# Patient Record
Sex: Female | Born: 1955
Health system: Southern US, Community
[De-identification: ages and names within clinical notes are randomized; demographics above are authoritative.]

## PROBLEM LIST (undated history)

## (undated) DIAGNOSIS — Z860100 Personal history of colon polyps, unspecified: Secondary | ICD-10-CM

## (undated) DIAGNOSIS — R011 Cardiac murmur, unspecified: Secondary | ICD-10-CM

## (undated) DIAGNOSIS — M1990 Unspecified osteoarthritis, unspecified site: Secondary | ICD-10-CM

## (undated) DIAGNOSIS — E785 Hyperlipidemia, unspecified: Secondary | ICD-10-CM

## (undated) DIAGNOSIS — K648 Other hemorrhoids: Secondary | ICD-10-CM

## (undated) DIAGNOSIS — N6459 Other signs and symptoms in breast: Secondary | ICD-10-CM

## (undated) DIAGNOSIS — Z87442 Personal history of urinary calculi: Secondary | ICD-10-CM

## (undated) DIAGNOSIS — K644 Residual hemorrhoidal skin tags: Secondary | ICD-10-CM

## (undated) DIAGNOSIS — I639 Cerebral infarction, unspecified: Secondary | ICD-10-CM

## (undated) DIAGNOSIS — I10 Essential (primary) hypertension: Secondary | ICD-10-CM

## (undated) DIAGNOSIS — K219 Gastro-esophageal reflux disease without esophagitis: Secondary | ICD-10-CM

## (undated) HISTORY — PX: ABDOMINAL HYSTERECTOMY: SHX81

## (undated) HISTORY — PX: ESOPHAGOGASTRODUODENOSCOPY: SHX1529

## (undated) HISTORY — DX: Personal history of urinary calculi: Z87.442

## (undated) HISTORY — DX: Personal history of colon polyps, unspecified: Z86.0100

## (undated) HISTORY — DX: Personal history of colonic polyps: Z86.010

## (undated) HISTORY — DX: Cerebral infarction, unspecified: I63.9

## (undated) HISTORY — PX: COLONOSCOPY: SHX174

## (undated) HISTORY — PX: WISDOM TOOTH EXTRACTION: SHX21

## (undated) HISTORY — DX: Cardiac murmur, unspecified: R01.1

## (undated) HISTORY — DX: Other signs and symptoms in breast: N64.59

## (undated) HISTORY — DX: Essential (primary) hypertension: I10

## (undated) HISTORY — DX: Hyperlipidemia, unspecified: E78.5

## (undated) HISTORY — DX: Residual hemorrhoidal skin tags: K64.4

## (undated) HISTORY — DX: Other hemorrhoids: K64.8

---

## 1998-11-11 ENCOUNTER — Other Ambulatory Visit: Admission: RE | Admit: 1998-11-11 | Discharge: 1998-11-11 | Payer: Self-pay | Admitting: Obstetrics and Gynecology

## 1999-12-05 ENCOUNTER — Other Ambulatory Visit: Admission: RE | Admit: 1999-12-05 | Discharge: 1999-12-05 | Payer: Self-pay | Admitting: Obstetrics and Gynecology

## 2001-01-07 ENCOUNTER — Other Ambulatory Visit: Admission: RE | Admit: 2001-01-07 | Discharge: 2001-01-07 | Payer: Self-pay | Admitting: Obstetrics and Gynecology

## 2001-01-23 ENCOUNTER — Encounter: Payer: Self-pay | Admitting: Obstetrics and Gynecology

## 2001-01-23 ENCOUNTER — Inpatient Hospital Stay (HOSPITAL_COMMUNITY): Admission: AD | Admit: 2001-01-23 | Discharge: 2001-01-23 | Payer: Self-pay | Admitting: Obstetrics and Gynecology

## 2001-02-25 ENCOUNTER — Encounter: Payer: Self-pay | Admitting: Obstetrics and Gynecology

## 2001-02-25 ENCOUNTER — Inpatient Hospital Stay (HOSPITAL_COMMUNITY): Admission: AD | Admit: 2001-02-25 | Discharge: 2001-02-26 | Payer: Self-pay | Admitting: Obstetrics and Gynecology

## 2001-04-19 ENCOUNTER — Observation Stay (HOSPITAL_COMMUNITY): Admission: AD | Admit: 2001-04-19 | Discharge: 2001-04-19 | Payer: Self-pay | Admitting: Obstetrics and Gynecology

## 2001-04-25 ENCOUNTER — Ambulatory Visit (HOSPITAL_COMMUNITY): Admission: RE | Admit: 2001-04-25 | Discharge: 2001-04-25 | Payer: Self-pay | Admitting: Obstetrics and Gynecology

## 2001-04-25 ENCOUNTER — Encounter: Payer: Self-pay | Admitting: Obstetrics and Gynecology

## 2001-06-25 ENCOUNTER — Inpatient Hospital Stay (HOSPITAL_COMMUNITY): Admission: RE | Admit: 2001-06-25 | Discharge: 2001-06-28 | Payer: Self-pay | Admitting: Obstetrics and Gynecology

## 2001-06-25 ENCOUNTER — Encounter (INDEPENDENT_AMBULATORY_CARE_PROVIDER_SITE_OTHER): Payer: Self-pay | Admitting: Specialist

## 2002-02-03 ENCOUNTER — Other Ambulatory Visit: Admission: RE | Admit: 2002-02-03 | Discharge: 2002-02-03 | Payer: Self-pay | Admitting: Obstetrics and Gynecology

## 2003-03-04 ENCOUNTER — Other Ambulatory Visit: Admission: RE | Admit: 2003-03-04 | Discharge: 2003-03-04 | Payer: Self-pay | Admitting: Obstetrics and Gynecology

## 2003-12-14 ENCOUNTER — Encounter: Admission: RE | Admit: 2003-12-14 | Discharge: 2004-03-13 | Payer: Self-pay | Admitting: Family Medicine

## 2004-04-28 ENCOUNTER — Other Ambulatory Visit: Admission: RE | Admit: 2004-04-28 | Discharge: 2004-04-28 | Payer: Self-pay | Admitting: Obstetrics and Gynecology

## 2004-07-20 ENCOUNTER — Ambulatory Visit (HOSPITAL_COMMUNITY): Admission: RE | Admit: 2004-07-20 | Discharge: 2004-07-20 | Payer: Self-pay | Admitting: Gastroenterology

## 2004-07-20 ENCOUNTER — Encounter (INDEPENDENT_AMBULATORY_CARE_PROVIDER_SITE_OTHER): Payer: Self-pay | Admitting: *Deleted

## 2008-01-03 HISTORY — PX: KIDNEY STONE SURGERY: SHX686

## 2008-02-18 ENCOUNTER — Ambulatory Visit (HOSPITAL_COMMUNITY): Admission: RE | Admit: 2008-02-18 | Discharge: 2008-02-18 | Payer: Self-pay | Admitting: Family Medicine

## 2008-06-25 ENCOUNTER — Encounter (HOSPITAL_COMMUNITY): Admission: RE | Admit: 2008-06-25 | Discharge: 2008-06-25 | Payer: Self-pay | Admitting: Cardiology

## 2008-09-09 ENCOUNTER — Ambulatory Visit (HOSPITAL_COMMUNITY): Admission: RE | Admit: 2008-09-09 | Discharge: 2008-09-09 | Payer: Self-pay | Admitting: Family Medicine

## 2008-09-11 ENCOUNTER — Ambulatory Visit (HOSPITAL_COMMUNITY): Admission: RE | Admit: 2008-09-11 | Discharge: 2008-09-11 | Payer: Self-pay | Admitting: Family Medicine

## 2009-01-05 ENCOUNTER — Emergency Department (HOSPITAL_COMMUNITY): Admission: EM | Admit: 2009-01-05 | Discharge: 2009-01-05 | Payer: Self-pay | Admitting: Emergency Medicine

## 2009-02-22 ENCOUNTER — Ambulatory Visit (HOSPITAL_COMMUNITY): Admission: RE | Admit: 2009-02-22 | Discharge: 2009-02-22 | Payer: Self-pay | Admitting: Urology

## 2010-03-19 LAB — COMPREHENSIVE METABOLIC PANEL
CO2: 25 mEq/L (ref 19–32)
Calcium: 8.7 mg/dL (ref 8.4–10.5)
Creatinine, Ser: 0.85 mg/dL (ref 0.4–1.2)
GFR calc Af Amer: >60 mL/min (ref >60)
GFR calc non Af Amer: >60 mL/min (ref >60)
Glucose, Bld: 190 mg/dL — ABNORMAL HIGH (ref 70–99)
Total Protein: 6.6 g/dL (ref 6.0–8.3)

## 2010-03-19 LAB — CBC
Hemoglobin: 13.4 g/dL (ref 12.0–15.0)
MCHC: 34.5 g/dL (ref 30.0–36.0)
MCV: 93.3 fL (ref 78.0–100.0)
RBC: 4.17 MIL/uL (ref 3.87–5.11)
RDW: 12.9 % (ref 11.5–15.5)

## 2010-03-19 LAB — URINALYSIS, ROUTINE W REFLEX MICROSCOPIC
Glucose, UA: 500 mg/dL — AB
Ketones, ur: 15 mg/dL — AB
Nitrite: NEGATIVE
pH: 5 (ref 5.0–8.0)

## 2010-03-19 LAB — URINE MICROSCOPIC-ADD ON

## 2010-03-19 LAB — DIFFERENTIAL
Lymphocytes Relative: 28 % (ref 12–46)
Lymphs Abs: 3.2 10*3/uL (ref 0.7–4.0)
Monocytes Relative: 4 % (ref 3–12)
Neutrophils Relative %: 65 % (ref 43–77)

## 2010-03-24 LAB — COMPREHENSIVE METABOLIC PANEL
ALT: 40 U/L — ABNORMAL HIGH (ref 0–35)
AST: 37 U/L (ref 0–37)
Alkaline Phosphatase: 45 U/L (ref 39–117)
CO2: 20 mEq/L (ref 19–32)
Chloride: 105 mEq/L (ref 96–112)
GFR calc Af Amer: >60 mL/min (ref >60)
GFR calc non Af Amer: 54 mL/min — ABNORMAL LOW (ref >60)
Glucose, Bld: 238 mg/dL — ABNORMAL HIGH (ref 70–99)
Potassium: 4.2 mEq/L (ref 3.5–5.1)
Sodium: 138 mEq/L (ref 135–145)
Total Bilirubin: 0.9 mg/dL (ref 0.3–1.2)

## 2010-03-24 LAB — GLUCOSE, CAPILLARY

## 2010-05-20 NOTE — Discharge Summary (Signed)
[facility] of [facility]  Patient:    Emma Duran, Emma Duran Visit Number: [id number] MRN: [id number]          Service Type: OBS Location: 9300 [id number] Attending Physician:  Frederich Balding Dictated by:   Juluis Mire, M.D. Admit Date:  04/19/2001 Discharge Date: 04/19/2001                             Discharge Summary  DIAGNOSIS:  Viral gastroenteritis.  DISCHARGE DIAGNOSIS:  Viral gastroenteritis.  PROCEDURE:  Hydration.  HISTORY OF PRESENT ILLNESS:  For complete history and physical, please see the dictated note.  HOSPITAL COURSE:  The patient was brought in, and begun on IV hydration and IV emetics.  Her laboratory work was significant in that her glucose was elevated at 179, her white count was 19,600, hemoglobin and platelet count were stable. She was maintained on IV hydration through the evening.  By the following morning was doing much better.  She remained afebrile.  Her abdominal examination was completely benign.  It was soft, minimal tenderness.  Bowel sounds remained active.  She was tolerating liquids, and was discharged home to follow up in the office on Monday.  In terms of complications, none were encountered during stay in the hospital.  CONDITION ON DISCHARGE:  The patient is discharged in stable condition.  DISPOSITION:  The patient is discharged home.  DISCHARGE MEDICATIONS:  She has Phenergan at home, as well as pain medications.  She is to call with worsening symptomatology.  FOLLOWUP:  On Monday. Dictated by:   Juluis Mire, M.D. Attending Physician:  Frederich Balding DD:  04/19/01 TD:  04/20/01 Job: [id number]

## 2010-05-20 NOTE — Discharge Summary (Signed)
[facility] of [facility]  Patient:    Emma Duran, Emma Duran Visit Number: [id number] MRN: [id number]          Service Type: GYN Location: 9300 [id number] Attending Physician:  Rhina Brackett Dictated by:   Duke Salvia. Marcelle Overlie, M.D. Admit Date:  06/25/2001 Discharge Date: 06/28/2001                             Discharge Summary  DISCHARGE DIAGNOSES:               1. Symptomatic leiomyoma.                                    2. Total abdominal hysterectomy/bilateral                                       salpingo-oophorectomy this admission.  HISTORY AND PHYSICAL EXAMINATION:  Please see admission H&P for details. Briefly, a 55 year old G1, P1, with a 17-month history of recurrent abdominal pelvic pain with document of leiomyoma presents now for definitive hysterectomy.  HOSPITAL COURSE:                   On June 25, 2001, under general anesthesia, the patient underwent TAH/BSO and excision of an anterior abdominal wall skin lesion.  On the first postop day she was afebrile, her hemoglobin was preop 12.8, on June 26, 2001 was 10.8, her diet was slowly advanced, the IV and catheter were removed, she remained afebrile.  On the second postop day again was afebrile, tolerating a regular diet, still complaining of some gas discomfort but was given a suppository at that point.  Later that afternoon she had a bowel movement and felt much improved and by June 28, 2001 morning was ambulating without difficulty, she had established normal bowel and urinary function, the incision was clean and dry and she was ready for discharge at that point.  LABORATORY DATA:                   Preop hemoglobin 12.8, postop on June 26, 2001 was 10.8.  UA negative.  Blood type A positive, antibody screen was negative.  CMET was normal.  EKG showed NSR.  Pathology report is still pending.  DISPOSITION:                       The patient is discharged on Tylox p.r.n. pain, she has a Climara  patch applied, will return to our office in 1 week to have the incision checked, advised to report any incisional redness or drainage, increased pain or bleeding or fever over 101.  She was given specific instructions regarding diet, sex, and exercise.  CONDITION:                         Good.  ACTIVITY:                          Graded increase. Dictated by:   Duke Salvia. Marcelle Overlie, M.D. Attending Physician:  Rhina Brackett DD:  06/28/01 TD:  06/30/01 Job: [id number]

## 2010-05-20 NOTE — Op Note (Signed)
Emma Duran, Emma Duran                ACCOUNT NO.:  [id number]   MEDICAL RECORD NO.:  [id number]          PATIENT TYPE:  AMB   LOCATION:  ENDO                         FACILITY[facility]  [facility]   PHYSICIAN:  Petra Kuba, M.D.    DATE OF BIRTH:  [birth year]   DATE OF PROCEDURE:  07/20/2004  DATE OF DISCHARGE:                                 OPERATIVE REPORT   PROCEDURE:  Colonoscopy biopsy.   INDICATIONS:  Screening.   Consent was signed after risks, benefits, methods, and options were  thoroughly discussed in the office by Jan, our colonoscopy nurse.   MEDICINES USED:  Demerol 70, Versed 7.   DESCRIPTION OF PROCEDURE:  Rectal inspection is pertinent for external  hemorrhoids, small. Digital exam was negative. Video pediatric adjustable  colonoscope was inserted and with abdominal pressure able to be advanced  around the colon to the cecum. No position changes were needed. No  abnormalities were seen on insertion. The cecum was identified by the  appendiceal orifice and the ileocecal valve. Prep was adequate. There was  some liquid stool that required washing and suctioning. On slow withdrawal  through the colon, the cecum, ascending, and transverse were normal. The  scope was withdrawn around the left side of the colon, there was an  occasional left-sided diverticula and some minimal probable prep induced  inflammation. In the sigmoid were three tiny bumps, two were hyperplastic  appearing and they were all cold biopsied x1 or 2 and put in the same  container. No other abnormalities were seen as we slowly withdrew back to  the rectum. Anorectal pull-through and retroflexion confirmed some small  hemorrhoids. The scope was straightened and readvanced a short ways up the  left side of the colon, air was suctioned, scope removed. The patient  tolerated the procedure well. There was no obvious immediate complication.   ENDOSCOPIC DIAGNOSIS:  1.  Internal and external hemorrhoids.  2.   Occasional left sided diverticula.  3.  Three tiny questionable left-sided polyps cold biopsied.  4.  Minimal left sided inflammation probably prep induced.  5.  Otherwise within normal limits to the cecum.   PLAN:  Await pathology to determine future colonic screening but probably  recheck in five years. Happy to see back p.r.n. otherwise return care to Dr.  Arvilla Market for the customary health care maintenance to include yearly  rectal's and guaiac's.       MEM/MEDQ  D:  07/20/2004  T:  07/20/2004  Job:  [id number]   cc:   Donia Guiles, M.D.  [address] [place]  [place] [address]  Fax: [phone]

## 2010-05-20 NOTE — H&P (Signed)
[facility] of [facility]  Patient:    Emma Duran, IGLEHART Visit Number: [id number] MRN: [id number]          Service Type: Attending:  Duke Salvia. Marcelle Overlie, M.D. Dictated by:   Duke Salvia. Marcelle Overlie, M.D. Adm. Date:  06/25/01                           History and Physical  CHIEF COMPLAINT:              Recurrent pelvic pain.  HISTORY OF PRESENT ILLNESS:   A 55 year old G1 P1 who has been using foam and condoms for contraception who has had a six-month history of recurrent abdominopelvic pain.  She was hospitalized early February with right lower quadrant pain felt to be secondary to a viral syndrome.  She had a sonohysterogram done around that time that showed a uterus that was 12 x 7 x 9.5 cm with multiple small fibroids in the 3 cm range.  Endometrial biopsy was performed at that time.  She was started on Hemocyte and Mircette b.i.d. to control her abnormal bleeding.  The endometrial biopsy showed proliferative endometrium; no other abnormalities noted.  She was continued on Mircette that did not effectively control her symptoms and presented again April 18, 2001 with lower abdominal pain, nausea, and vomiting.  A CT scan was done April 25, 2001 that showed liver, gallbladder, and upper abdomen unremarkable, enlarged uterus with fibroids noted.  These recurrent episodes of pain did seem to localize into the pelvic area consistent with being secondary to leiomyoma.  She presents now for a TAH/BSO. This procedure including risks of bleeding, infection, transfusion, adjacent organ injury, the possible need for additional surgery discussed.  Other complications such as wound infection, phlebitis, the possible need for ERT all reviewed with her which she understands and accepts.  PAST MEDICAL HISTORY:  ALLERGIES:                    None.  OPERATIONS:                   None.  OBSTETRICAL HISTORY:          One vaginal delivery at term.  REVIEW OF SYSTEMS:             Otherwise negative.  CURRENT MEDICATIONS:          Vicoprofen p.r.n.  FAMILY HISTORY:               Noncontributory.  SOCIAL HISTORY:               Unremarkable.  PHYSICAL EXAMINATION:  VITAL SIGNS:                  Temperature 98.2, blood pressure 130/82.  HEENT:                        Unremarkable.  NECK:                         Supple without masses.  LUNGS:                        Clear.  CARDIOVASCULAR:               Regular rate and rhythm without murmurs, rubs, or gallops noted.  BREASTS:  Without masses.  ABDOMEN:                      Soft, flat, and nontender.  PELVIC:                       Normal external genitalia, vagina and cervix clear.  Uterus was 10-12 weeks size.  Adnexa negative.  IMPRESSION:                   Chronic pelvic pain secondary to leiomyoma.  PLAN:                         TAH/BSO.  Procedure and risks reviewed as above. Dictated by:   Duke Salvia. Marcelle Overlie, M.D. Attending:  Duke Salvia. Marcelle Overlie, M.D. DD:  06/20/01 TD:  06/20/01 Job: [id number]

## 2010-05-20 NOTE — H&P (Signed)
[facility] of [facility]  Patient:    Emma Duran, Emma Duran Visit Number: [id number] MRN: [id number]          Service Type: OBS Location: 9300 [id number] Attending Physician:  Frederich Balding Dictated by:   Juluis Mire, M.D. Admit Date:  04/19/2001                           History and Physical  REASON FOR ADMISSION:         The patient is a 55 year old gravida 1 para 1 married white female who presents complaining of increasing nausea and vomiting.  This has been unresponsive to antiemetics at home.  HISTORY OF PRESENT ILLNESS:   The patient was seen in the office on April 18, 2001.  She had been experiencing increasing nausea, vomiting, and pelvic pain. She had been called in some p.o. as well as some suppository form of Phenergan.  Exam in the office revealed a temperature of 99.4.  Her abdominal exam was basically benign.  It was felt that she had a viral gastroenteritis. She subsequently reported later this evening that nothing was staying down despite the Phenergan suppositories.  Was brought in for IV hydration and observation.  She states her bowel function had been normal.  She had no voiding difficulties.  She states she had not felt like she had a temperature at home and her abdominal pain had improved somewhat.  ALLERGIES:                    No known drug allergies.  MEDICATIONS:                  Lipitor, WelChol, and lisinopril.  PAST MEDICAL HISTORY:         She has had the usual childhood diseases without significant sequelae.  PAST SURGICAL HISTORY:        No prior surgical history.  OBSTETRICAL HISTORY:          She has had one spontaneous vaginal delivery.  FAMILY HISTORY:               Noncontributory.  SOCIAL HISTORY:               No tobacco or alcohol use.  REVIEW OF SYSTEMS:            Noncontributory.  PHYSICAL EXAMINATION:  VITAL SIGNS:                  Patient is afebrile with stable vital signs.  LUNGS:                         Clear.  CARDIOVASCULAR:               Regular rhythm and rate, no murmurs or gallops.  ABDOMEN:                      Is basically benign.  Bowel sounds are active. She has mild suprapubic tenderness, no peritoneal signs.  PELVIC:                       Uterus is upper limits of normal size, slightly irregular.  Adnexa unremarkable.  There is really not significant pelvic tenderness.  EXTREMITIES:                  Trace edema.  NEUROLOGIC:  Grossly within normal limits.  BASIC IMPRESSION:             Possible viral gastroenteritis.  PLAN:                         The patient will be brought in for IV hydration, antiemetics.  We will seen how she responds through the evening. Dictated by:   Juluis Mire, M.D. Attending Physician:  Frederich Balding DD:  04/19/01 TD:  04/19/01 Job: [id number]

## 2010-05-20 NOTE — Op Note (Signed)
[facility] of [facility]  Patient:    Emma Duran, Emma Duran Visit Number: [id number] MRN: [id number]          Service Type: GYN Location: 9300 [id number] 01 Attending Physician:  Rhina Brackett Dictated by:   Duke Salvia. Marcelle Overlie, M.D. Proc. Date: 06/25/01 Admit Date:  06/25/2001                             Operative Report  PREOPERATIVE DIAGNOSIS:       Chronic pelvic pain, leiomyoma, anterior abdominal wall skin lesion.  POSTOPERATIVE DIAGNOSIS:      Chronic pelvic pain, leiomyoma, anterior abdominal wall skin lesion.  OPERATION:                    TAH, BSO, excision of anterior abdominal wall skin lesion.  SURGEON:                      Duke Salvia. Marcelle Overlie, M.D.  ASSISTANT:                    Raynald Kemp, M.D.  ANESTHESIA:                   General endotracheal anesthesia.  COMPLICATIONS:                None.  DRAINS:                       Foley catheter.  ESTIMATED BLOOD LOSS:         200 cc.  DESCRIPTION OF PROCEDURE:     The patient was taken to the operating room and after an adequate level of general endotracheal anesthesia was obtained with the patient supine, the abdomen was prepped and draped in the usual fashion for sterile abdominal procedures.  The vagina was prepped separately with Betadine and Foley catheter positioned.  A transverse incision was made three fingerbreadths above the symphysis and carried down to the fascia which was incised and extended transversely.  The rectus muscles were divided in the midline and the peritoneum entered superiorly without incident and extended in a vertical manner.  The upper abdomen was explored and was unremarkable. OConnor-OSullivan retractor was positioned.  The bowel was packed superiorly out of the field.  The patient was placed in Trendelenburg.  The uterus was enlarged to approximately 12 weeks size with a large fundal fibroid.  Adnexa were unremarkable.  Long Kelly clamps were then placed at  each utero-ovarian pedicle.  Starting on the left, the round ligament was coagulated and cut. The retroperitoneal space on that side was developed.  The left IP ligament was identified.  The course of the left ureter was well below.  The left IP ligament was coagulated x2 and cut.  The exact same repeated on the opposite side again after carefully identifying the course of the ureter well below the operative site.  The anterior peritoneum was carried around to the midline because the large fundal fibroid was obstructing the view, a myomectomy was performed removing the fibroid to allow for better visualization.  The bladder flap was then advanced inferiorly with sharp and blunt dissection.  The uterine vasculature pedicle was skeletonized, clamped, coagulated, and cut. The same repeated on the opposite side.  The clamp, coag, and cut technique used down to the area of the uterosacral ligaments staying close to the uterus.  The  uterosacral ligament was then clamped, divided, and suture ligated with 0 Dexon on either side.  The remaining cervical vaginal pedicle was clamped, divided, and suture ligated with 0 Dexon and held and the specimen excised.  The vaginal cuff closed with a running locked 2-0 Dexon suture.  The pelvis was then irrigated with saline and aspirated.  All major pedicles were inspected and noted to be hemostatic.  Prior to closure, sponge, needle, and instrument counts were correct x2.  The rectus muscles were reapproximated with 2-0 Dexon interrupted sutures.  The fascia closed from laterally to midline on either side with a 0 PDS suture.  The subcutaneous fat was hemostatic.  Clips and Steri-Strips were used on the skin.  The upper abdominal skin lesion was prepped separately, excised in an ellipse.  Six interrupted 4-0 nylon sutures used to reapproximated the skin with excellent hemostasis.  She tolerated this well and went to the recovery room in  good condition. Dictated by:   Duke Salvia. Marcelle Overlie, M.D. Attending Physician:  Rhina Brackett DD:  06/25/01 TD:  06/25/01 Job: [id number]

## 2010-05-20 NOTE — Discharge Summary (Signed)
[facility] of [facility]  Patient:    Emma Duran, Emma Duran Visit Number: [id number] MRN: [id number]          Service Type: Attending:  Duke Salvia. Marcelle Overlie, M.D. Dictated by:   Duke Salvia. Marcelle Overlie, M.D. Adm. Date:  02/25/01 Disc. Date: 02/26/01                             Discharge Summary  DISCHARGE DIAGNOSES:          Probable viral gastroenteritis.  SUMMARY OF HISTORY & PHYSICAL: Please see admission note for details. Briefly, 55 year old, G1, P62, LMP February 14, presents with a 24-hour history of nausea and vomiting.  She was seen in triage with a temperature of 99.1, examined by the nurse practitioner and was felt to have nausea and vomiting and unspecified abdominal pain along with leiomyomata noted on ultrasound. She was admitted for pain management and further hydration.  HOSPITAL COURSE:              On admission, temperature 99.2, blood pressure 156/88.  WBC 13.3, hemoglobin 14.  Urine pregnancy test was negative. UA was negative except for small ketones.  Ultrasound showed moderate free fluid with leiomyoma.  On the following a.m., temperature was 99.3, WBC 8900.  Ultrasound was normal except for two fibroids in the 3 cm range.  Her abdominal exam on February 25 was much improved.  Her IV hydration was continued until that afternoon, and she was discharged home on antiemetics and Darvocet for pain. She will return to our office in one week.  DISPOSITION:                  The patient was discharged on Zofran p.r.n. nausea, Darvocet p.r.n. pain.  She will return to our office in two to three days.  Advise or report fever of 101, persistent nausea or vomiting, or worsening of abdominal pain.Dictated by:   Duke Salvia. Marcelle Overlie, M.D. Attending:  Duke Salvia. Marcelle Overlie, M.D. DD:  04/08/01 TD:  04/08/01 Job: [id number]

## 2010-05-28 ENCOUNTER — Inpatient Hospital Stay (INDEPENDENT_AMBULATORY_CARE_PROVIDER_SITE_OTHER)
Admission: RE | Admit: 2010-05-28 | Discharge: 2010-05-28 | Disposition: A | Discharge status: Home / Self Care | Payer: Self-pay | Source: Ambulatory Visit | Attending: Family Medicine | Admitting: Family Medicine

## 2010-05-28 ENCOUNTER — Encounter: Payer: Self-pay | Admitting: Family Medicine

## 2010-05-28 DIAGNOSIS — I10 Essential (primary) hypertension: Secondary | ICD-10-CM | POA: Insufficient documentation

## 2010-05-28 DIAGNOSIS — L509 Urticaria, unspecified: Secondary | ICD-10-CM

## 2010-05-28 DIAGNOSIS — E119 Type 2 diabetes mellitus without complications: Secondary | ICD-10-CM | POA: Insufficient documentation

## 2010-05-28 DIAGNOSIS — E785 Hyperlipidemia, unspecified: Secondary | ICD-10-CM | POA: Insufficient documentation

## 2010-05-30 ENCOUNTER — Telehealth (INDEPENDENT_AMBULATORY_CARE_PROVIDER_SITE_OTHER): Payer: Self-pay | Admitting: *Deleted

## 2010-08-25 ENCOUNTER — Other Ambulatory Visit: Payer: Self-pay

## 2010-08-30 ENCOUNTER — Encounter (INDEPENDENT_AMBULATORY_CARE_PROVIDER_SITE_OTHER): Payer: Self-pay | Admitting: General Surgery

## 2010-09-08 ENCOUNTER — Encounter (INDEPENDENT_AMBULATORY_CARE_PROVIDER_SITE_OTHER): Payer: Self-pay | Admitting: General Surgery

## 2010-09-08 ENCOUNTER — Ambulatory Visit (INDEPENDENT_AMBULATORY_CARE_PROVIDER_SITE_OTHER): Payer: Commercial Managed Care - PPO | Admitting: General Surgery

## 2010-09-08 VITALS — BP 112/80 | HR 58 | Temp 96.8°F | Ht 66.5 in | Wt 205.0 lb

## 2010-09-08 DIAGNOSIS — R921 Mammographic calcification found on diagnostic imaging of breast: Secondary | ICD-10-CM

## 2010-09-08 DIAGNOSIS — R928 Other abnormal and inconclusive findings on diagnostic imaging of breast: Secondary | ICD-10-CM

## 2010-09-08 NOTE — Patient Instructions (Signed)
Your recent needle biopsy of the right breast shows no cancer, but the area of microcalcifications is abnormal. I agree that you should have this area excised for complete evaluation to be sure that there is not occult cancer somewhere within this area. We will schedule you for a right breast biopsy with needle localization in the future.

## 2010-09-08 NOTE — Progress Notes (Signed)
Chief Complaint  Patient presents with  . Other    new pt- eval of right breast abnormality     HPI Emma Duran is a 55 y.o. female.    This is a pleasant 55 year old female, referred to me Dr. Rogelia Mire at [facility] for consideration of excisional biopsy of an area of microcalcifications in the superior and posterior area of the right breast.  The patient has never had a breast problem in the past. She has never had a breast biopsy in the past. She has had routine mammograms and some focused ultrasounds in the past with particular attention to the right breast and has been told that everything looks like "fatty tissue".  Recent mammogram showed a cluster of microcalcifications in the right breast and superiorly and posteriorly. I reviewed these and they are somewhat worrisome in their morphology, but I do not see a mass affect. A core biopsy was performed by Dr. Tilda Burrow and this revealed calcifications, fibroadenoma, and some extra mucin. Dr. Colonel Bald could not rule out a mucocele lesion. I agree that this area needs to be excised.  The patient's health is fairly stable. She does have hypertension, hyperlipidemia, diabetes, history of kidney stones, and obesity.  Her husband has recently been treated for lung cancer, with curative intent.  Family history is negative for breast cancer or ovarian cancer. HPI  Past Medical History  Diagnosis Date  . Diabetes mellitus   . Hyperlipidemia   . Hypertension   . Abnormal breast exam     chip in right breast as marker for abnormality     Past Surgical History  Procedure Date  . Abdominal hysterectomy   . Kidney stone surgery 2010    Family History  Problem Relation Age of Onset  . Diabetes Mother   . Lung cancer Father   . Cancer Father 42    lung cancer   . Lung cancer Paternal Grandmother   . Cancer Paternal Grandfather     Social History History  Substance Use Topics  . Smoking status: Never Smoker   .  Smokeless tobacco: Not on file  . Alcohol Use: No    Allergies  Allergen Reactions  . Omeprazole     Full body hives and itching   . Percocet (Oxycodone-Acetaminophen)     No reaction   . Morphine And Related Anxiety    Current Outpatient Prescriptions  Medication Sig Dispense Refill  . colesevelam (WELCHOL) 625 MG tablet Take 1,875 mg by mouth 3 (three) times daily.        Marland Kitchen estradiol (ESTRACE) 0.5 MG tablet Take 1.5 mg by mouth daily.       Marland Kitchen glimepiride (AMARYL) 4 MG tablet Take 4.5 mg by mouth daily before breakfast.       . linagliptin (TRADJENTA) 5 MG TABS tablet Take 5 mg by mouth daily.        Marland Kitchen lisinopril-hydrochlorothiazide (PRINZIDE,ZESTORETIC) 10-12.5 MG per tablet Take 1 tablet by mouth daily.        . metFORMIN (GLUMETZA) 1000 MG (MOD) 24 hr tablet Take 1,000 mg by mouth daily with breakfast.        . potassium citrate (UROCIT-K) 10 MEQ (1080 MG) SR tablet Take 10 mEq by mouth 4 (four) times daily.        . simvastatin (ZOCOR) 40 MG tablet Take 40 mg by mouth at bedtime.        Marland Kitchen VITAMIN D, ERGOCALCIFEROL, PO Take by mouth.        Marland Kitchen  Multiple Vitamins-Minerals (MULTIVITAMIN WITH MINERALS) tablet Take 1 tablet by mouth daily.        . ondansetron (ZOFRAN) 4 MG tablet Take 4 mg by mouth every 8 (eight) hours as needed.        . silodosin (RAPAFLO) 8 MG CAPS capsule Take 8 mg by mouth daily with breakfast.          Review of Systems Review of Systems  Constitutional: Negative.   HENT: Negative.   Eyes: Negative.   Respiratory: Negative.   Cardiovascular: Negative.   Gastrointestinal: Negative.   Genitourinary: Negative.   Musculoskeletal: Negative.   Neurological: Negative.   Hematological: Negative.   Psychiatric/Behavioral: Negative.     Blood pressure 112/80, pulse 58, temperature 96.8 F (36 C), height 5' 6.5" (1.689 m), weight 205 lb (92.987 kg).  Physical Exam Physical Exam  Constitutional: She is oriented to person, place, and time. She appears  well-developed and well-nourished. No distress.  HENT:  Head: Normocephalic.  Right Ear: External ear normal.  Mouth/Throat: Oropharynx is clear and moist. No oropharyngeal exudate.  Eyes: Conjunctivae are normal. Pupils are equal, round, and reactive to light. Right eye exhibits discharge. No scleral icterus.  Neck: Normal range of motion. Neck supple. No JVD present. No tracheal deviation present. No thyromegaly present.  Cardiovascular: Normal rate, regular rhythm, normal heart sounds and intact distal pulses.   No murmur heard. Pulmonary/Chest: Effort normal and breath sounds normal. No respiratory distress. She has no wheezes. She has no rales. She exhibits no tenderness.    Abdominal: Soft. Bowel sounds are normal. She exhibits no distension and no mass. There is no tenderness. There is no rebound and no guarding.    Musculoskeletal: Normal range of motion. She exhibits no edema and no tenderness.  Lymphadenopathy:    She has no cervical adenopathy.  Neurological: She is alert and oriented to person, place, and time. She exhibits normal muscle tone. Coordination normal.  Skin: Skin is warm and dry. No rash noted. She is not diaphoretic. No erythema. No pallor.  Psychiatric: She has a normal mood and affect. Her behavior is normal. Thought content normal.    Data Reviewed I reviewed all the mammograms, the pathology report, and office records from Dr. Patsi Sears. Her EKG is also here.  Assessment    Abnormal microcalcifications right breast, superiorly and posteriorly, upper outer quadrant.  These microcalcifications have a worrisome morphology. In addition her pathology report shows mucin and we cannot rule out a mucocele lesion. Excisional biopsy is indicated to rule out occult carcinoma.  Obesity  Non-insulin-dependent diabetes mellitus  Hypertension  Hyperlipidemia  Status post abdominal hysterectomy and bilateral salpingo-oophorectomy for benign ovarian  tumor.  History of kidney stones.    Plan    Scheduled for right partial mastectomy with needle localization and specimen mammogram.  I have discussed indications and details of surgery with her. Risks and complications have been outlined, including but not limited to bleeding, infection, cosmetic deformity, skin necrosis, nerve damage chronic pain or numbness, reoperation if this turns out to be malignant, and other  problems. She seems to understand these issues well. All of her questions are answered. Se's been given patient information booklet. She is in full agreement with this plan.       Novalyn Lajara M 09/08/2010, 10:08 AM

## 2010-09-20 ENCOUNTER — Other Ambulatory Visit (INDEPENDENT_AMBULATORY_CARE_PROVIDER_SITE_OTHER): Payer: Self-pay | Admitting: General Surgery

## 2010-09-20 ENCOUNTER — Encounter (HOSPITAL_COMMUNITY)
Admission: RE | Admit: 2010-09-20 | Discharge: 2010-09-20 | Disposition: A | Discharge status: Home / Self Care | Payer: 59 | Source: Ambulatory Visit | Attending: General Surgery | Admitting: General Surgery

## 2010-09-20 DIAGNOSIS — N6310 Unspecified lump in the right breast, unspecified quadrant: Secondary | ICD-10-CM

## 2010-09-20 LAB — CBC
HCT: 40.1 % (ref 36.0–46.0)
Platelets: 307 10*3/uL (ref 150–400)
RDW: 12.9 % (ref 11.5–15.5)
WBC: 10.9 10*3/uL — ABNORMAL HIGH (ref 4.0–10.5)

## 2010-09-20 LAB — URINALYSIS, ROUTINE W REFLEX MICROSCOPIC
Glucose, UA: 250 mg/dL — AB
Hgb urine dipstick: NEGATIVE
Specific Gravity, Urine: 1.028 (ref 1.005–1.030)
Urobilinogen, UA: 0.2 mg/dL (ref 0.0–1.0)
pH: 5.5 (ref 5.0–8.0)

## 2010-09-20 LAB — DIFFERENTIAL
Basophils Absolute: 0.1 10*3/uL (ref 0.0–0.1)
Eosinophils Absolute: 0.5 10*3/uL (ref 0.0–0.7)
Eosinophils Relative: 5 % (ref 0–5)
Lymphocytes Relative: 43 % (ref 12–46)
Neutrophils Relative %: 46 % (ref 43–77)

## 2010-09-20 LAB — COMPREHENSIVE METABOLIC PANEL
Albumin: 3.7 g/dL (ref 3.5–5.2)
Alkaline Phosphatase: 52 U/L (ref 39–117)
BUN: 10 mg/dL (ref 6–23)
CO2: 25 mEq/L (ref 19–32)
Chloride: 102 mEq/L (ref 96–112)
Creatinine, Ser: 0.7 mg/dL (ref 0.50–1.10)
GFR calc non Af Amer: >60 mL/min (ref >60)
Potassium: 4.4 mEq/L (ref 3.5–5.1)
Total Bilirubin: 0.4 mg/dL (ref 0.3–1.2)

## 2010-09-20 LAB — SURGICAL PCR SCREEN
MRSA, PCR: NEGATIVE
Staphylococcus aureus: NEGATIVE

## 2010-09-28 ENCOUNTER — Other Ambulatory Visit (INDEPENDENT_AMBULATORY_CARE_PROVIDER_SITE_OTHER): Payer: Self-pay | Admitting: General Surgery

## 2010-09-28 ENCOUNTER — Ambulatory Visit (HOSPITAL_COMMUNITY)
Admission: RE | Admit: 2010-09-28 | Discharge: 2010-09-28 | Disposition: A | Discharge status: Home / Self Care | Payer: 59 | Source: Ambulatory Visit | Attending: General Surgery | Admitting: General Surgery

## 2010-09-28 DIAGNOSIS — D249 Benign neoplasm of unspecified breast: Secondary | ICD-10-CM

## 2010-09-28 DIAGNOSIS — E119 Type 2 diabetes mellitus without complications: Secondary | ICD-10-CM | POA: Insufficient documentation

## 2010-09-28 DIAGNOSIS — R920 Mammographic microcalcification found on diagnostic imaging of breast: Secondary | ICD-10-CM | POA: Insufficient documentation

## 2010-09-28 DIAGNOSIS — Z87442 Personal history of urinary calculi: Secondary | ICD-10-CM | POA: Insufficient documentation

## 2010-09-28 HISTORY — PX: BREAST LUMPECTOMY: SHX2

## 2010-09-28 LAB — GLUCOSE, CAPILLARY: Glucose-Capillary: 106 mg/dL — ABNORMAL HIGH (ref 70–99)

## 2010-10-05 ENCOUNTER — Telehealth (INDEPENDENT_AMBULATORY_CARE_PROVIDER_SITE_OTHER): Payer: Self-pay | Admitting: General Surgery

## 2010-10-08 NOTE — Op Note (Signed)
  Emma Duran, Emma Duran                ACCOUNT NO.:  [id number]  MEDICAL RECORD NO.:  [id number]  LOCATION:  [facility]                         FACILITY:  [facility]  PHYSICIAN:  Angelia Mould. Derrell Lolling, M.D.DATE OF BIRTH:  [birth year]  DATE OF PROCEDURE:  09/28/2010 DATE OF DISCHARGE:                              OPERATIVE REPORT   PREOPERATIVE DIAGNOSIS:  Abnormal microcalcifications, right breast, upper outer quadrant.  POSTOPERATIVE DIAGNOSIS:  Abnormal microcalcifications, right breast, upper outer quadrant.  OPERATION PERFORMED:  Right partial mastectomy with needle localization.  SURGEON:  Angelia Mould. Derrell Lolling, MD  OPERATIVE INDICATIONS:  This is a 54 year old Caucasian female who has no prior history of breast disease.  Recent mammogram showed a cluster of microcalcifications in the right breast, upper outer quadrant, posteriorly.  These were worrisome.  Image-guided biopsies reveal calcifications, fibroadenoma, and some extra mucin.  Dr. Colonel Bald, Pathology says that he could not rule out a mucocele lesion.  Excision was suggested.  Physical exam reveals only some bruising from the recent biopsy.  No adenopathy.  This was discussed and agreed that we would proceed with excision of this area.  OPERATIVE TECHNIQUE:  The patient underwent wire localization at [facility] this morning.  The wire entered in the far lateral posterior aspect of the breast and was directed medially, but was well placed.  The patient was then brought to the operating room at [facility].  General anesthesia was induced.  The right breast was prepped and draped in a sterile fashion.  Intravenous antibiotics were given. Surgical time-out was held identifying correct patient and correct procedure and correct site.  Marcaine 0.5% with epinephrine was used as local infiltration anesthetic.  I chose to use a curvilinear, circumareolar type of incision in the far lateral aspect of the right breast.  This  followed the skin lines quite nicely.  Dissection was carried down deeply into the breast around the localizing wire.  We felt that we stayed around the wire fairly nicely.  The specimen was removed and marked with the 6- color margin marker kit.  Specimen mammogram was obtained and showed intact wire within the specimen and intact marker clip.  The specimen was marked and sent for routine pathology.  The wound was irrigated with saline.  Hemostasis was excellent and achieved electrocautery.  The breast tissue was closed in several layers with interrupted sutures of 3-0 Vicryl and the skin closed with running subcuticular suture of 4-0 Monocryl and Dermabond.  The patient was taken to recovery room in stable condition.  Estimated blood loss was about 15 mL.  Complications none.  Sponge, needle and instrument counts were correct.     Angelia Mould. Derrell Lolling, M.D.     HMI/MEDQ  D:  09/28/2010  T:  09/28/2010  Job:  [id number]  cc:   Rogelia Mire, MD Lupita Raider, M.D.  Electronically Signed by Claud Kelp M.D. on 10/08/2010 04:03:11 PM

## 2010-10-11 ENCOUNTER — Telehealth (INDEPENDENT_AMBULATORY_CARE_PROVIDER_SITE_OTHER): Payer: Self-pay | Admitting: General Surgery

## 2010-10-11 NOTE — Telephone Encounter (Signed)
Message copied by Latricia Heft on Tue Oct 11, 2010 10:01 AM ------      Message from: Ernestene Mention      Created: Mon Oct 10, 2010 12:39 PM       Be sure that we have called path report to patient.

## 2010-10-11 NOTE — Telephone Encounter (Signed)
Contacted Mrs Dahan and gave her the path report. Post op appt scheduled

## 2010-10-11 NOTE — Telephone Encounter (Signed)
Notified patient of path report and scheduled follow up

## 2010-10-27 ENCOUNTER — Encounter (INDEPENDENT_AMBULATORY_CARE_PROVIDER_SITE_OTHER): Payer: Self-pay | Admitting: General Surgery

## 2010-10-31 ENCOUNTER — Ambulatory Visit (INDEPENDENT_AMBULATORY_CARE_PROVIDER_SITE_OTHER): Payer: Commercial Managed Care - PPO | Admitting: General Surgery

## 2010-10-31 ENCOUNTER — Encounter (INDEPENDENT_AMBULATORY_CARE_PROVIDER_SITE_OTHER): Payer: Self-pay | Admitting: General Surgery

## 2010-10-31 VITALS — BP 118/82 | HR 56 | Temp 96.7°F | Resp 18 | Ht 66.5 in | Wt 205.5 lb

## 2010-10-31 DIAGNOSIS — Z9889 Other specified postprocedural states: Secondary | ICD-10-CM

## 2010-10-31 NOTE — Patient Instructions (Signed)
You have been given a copy of her pathology report, which shows a benign fibroadenoma. Her wound is healing without any obvious complication. Your advised to get annual mammography and an annual breast exam by one of her physicians. Return to see me if you develop any new problems.

## 2010-10-31 NOTE — Progress Notes (Signed)
Subjective:     Patient ID: Emma Duran, female   DOB: [birth year], 55 y.o.   MRN: [id number]  HPI  This patient underwent right partial mastectomy with needle localization on September 28, 2010. Final pathology report shows a fibroadenoma, but no evidence of atypia or malignancy. Patient has been advised of this and we gave her a copy of her pathology report today.  She has no complaints about wound healing. She has no pain or swelling or drainage.  Review of Systems     Objective:   Physical Exam  Right breast incision in the upper outer quadrant looks fine. The tissues are soft. There is no fluid collection or tenderness or drainage. Cosmetic result is very good.  The patient is in no distress.    Assessment:     Benign fibroadenoma right breast, upper outer quadrant, uneventful recovery following right partial mastectomy with needle localization.    Plan:     Advised annual breast exam and annual mammography.  Return to see me p.r.n.

## 2010-12-05 NOTE — Telephone Encounter (Signed)
  Phone Note Outgoing Call Call back at [facility] Phone [phone]   Call placed by: Lajean Saver RN,  May 30, 2010 10:58 AM Call placed to: Patient Summary of Call: Callback: Listed home phone # is not in service. Called patient's work #, no answer.

## 2010-12-05 NOTE — Progress Notes (Signed)
Summary: rash/TM (room 3)   Vital Signs:  Patient Profile:   55 Years Old Female CC:      Hive rash scattered x 24 hours Height:     67 inches Weight:      213.25 pounds O2 Sat:      95 % O2 treatment:    Room Air Temp:     98.5 degrees F oral Pulse rate:   105 / minute Resp:     16 per minute BP sitting:   130 / 85  (left arm) Cuff size:   large  Pt. in pain?   no  Vitals Entered By: Lavell Islam RN (May 28, 2010 3:28 PM)                   Updated Prior Medication List: * ESTRADIOL 1.5 MG TABS (ESTRADIOL) daily * GLIMEPIRIDE 4.5 MG TABS (GLIMEPIRIDE) daily LISINOPRIL 10 MG TABS (LISINOPRIL) daily METFORMIN HCL 1000 MG TABS (METFORMIN HCL) 2 per day NAPROSYN 500 MG TABS (NAPROXEN) daily TRADJENTA 5 MG TABS (LINAGLIPTIN) daily SIMVASTATIN 40 MG TABS (SIMVASTATIN) daily * CALCIUM-VITAMIN D 5,000 UNITS daily * OMPERAZOLE   Current Allergies: ! MORPHINE ! PERCOCETHistory of Present Illness Chief Complaint: Hive rash scattered x 24 hours History of Present Illness:    Subjective:  Patient complains of onset of pruritic hives on abdomen and arms yesterday that has persisted.  No shortness of breath or difficulty swallowing.  She  states that she started taking a prescription for omeprazole 40mg  four days ago; no other recent changes.  No history of seasonal allergies or asthm.  No known contact with allergens.  She feels well otherwise.  Benadryl has not been very helpful to relieve symptoms.  She notes that she has taken prednisone in the past without adverse effect on her glucose.  REVIEW OF SYSTEMS Constitutional Symptoms      Denies fever, chills, night sweats, weight loss, weight gain, and fatigue.  Eyes       Denies change in vision, eye pain, eye discharge, glasses, contact lenses, and eye surgery. Ear/Nose/Throat/Mouth       Denies hearing loss/aids, change in hearing, ear pain, ear discharge, dizziness, frequent runny nose, frequent nose bleeds, sinus  problems, sore throat, hoarseness, and tooth pain or bleeding.  Respiratory       Denies dry cough, productive cough, wheezing, shortness of breath, asthma, bronchitis, and emphysema/COPD.  Cardiovascular       Denies murmurs, chest pain, and tires easily with exhertion.    Gastrointestinal       Denies stomach pain, nausea/vomiting, diarrhea, constipation, blood in bowel movements, and indigestion. Genitourniary       Denies painful urination, kidney stones, and loss of urinary control. Neurological       Denies paralysis, seizures, and fainting/blackouts. Musculoskeletal       Denies muscle pain, joint pain, joint stiffness, decreased range of motion, redness, swelling, muscle weakness, and gout.  Skin       Denies bruising, unusual mles/lumps or sores, and hair/skin or nail changes.      Comments: scattered whelps Psych       Denies mood changes, temper/anger issues, anxiety/stress, speech problems, depression, and sleep problems. Other Comments: Scattered rash/whelps possible medication related x 24 hours   Past History:  Family History: Last updated: 05/28/2010 lung cancer  Social History: Last updated: 05/28/2010 Married Never Smoked Alcohol use-no Drug use-no  Past Medical History: Diabetes mellitus, type II Hyperlipidemia Hypertension  Past Surgical History:  Hysterectomy renal calculi removal  Family History: lung cancer  Social History: Married Never Smoked Alcohol use-no Drug use-no Smoking Status:  never Drug Use:  no   Objective:  No acute distress, alert and oriented  Eyes:  Pupils are equal, round, and reactive to light and accomodation.  Extraocular movement is intact.  Conjunctivae are not inflamed.  Mouth/pharynx:  No lesions Neck:  Supple.  No adenopathy is present.   Lungs:  Clear to auscultation.  Breath sounds are equal.  Heart:  Regular rate and rhythm without murmurs, rubs, or gallops.  Abdomen:  Nontender without masses or  hepatosplenomegaly.  Bowel sounds are present.  No CVA or flank tenderness.  Extremities:  No edema. Skin:  Urticarial lesions on trunk and upper extremities. Assessment New Problems: URTICARIA, ACUTE (ICD-708.9) HYPERTENSION (ICD-401.9) HYPERLIPIDEMIA (ICD-272.4) DIABETES MELLITUS, TYPE II (ICD-250.00)  SUSPECT ADVERSE REACTION TO OMEPRAZOLE  Plan New Medications/Changes: VISTARIL 50 MG CAPS (HYDROXYZINE PAMOATE) One by mouth q6 to 8hr as needed itching  #20 x 1, 05/28/2010, Donna Christen MD PREDNISONE 10 MG TABS (PREDNISONE) 2 PO today, then 2 BID for 2 days, then 1 two times a day for 2 days, then 1 daily for 2 days.  Take PC  #16 x 0, 05/28/2010, Donna Christen MD OMPERAZOLE  #30 x 0, 05/28/2010, Donna Christen MD  New Orders: New Patient Level III [phone] Services provided After hours-Weekends-Holidays [99051] Planning Comments:   Begin tapering course of prednisone.  Vistaril for itching.  Avoid taking omeprazole.  Monitor glucose while taking prednisone. Follow-up with PCP if not improving.   The patient and/or caregiver has been counseled thoroughly with regard to medications prescribed including dosage, schedule, interactions, rationale for use, and possible side effects and they verbalize understanding.  Diagnoses and expected course of recovery discussed and will return if not improved as expected or if the condition worsens. Patient and/or caregiver verbalized understanding.  Prescriptions: VISTARIL 50 MG CAPS (HYDROXYZINE PAMOATE) One by mouth q6 to 8hr as needed itching  #20 x 1   Entered and Authorized by:   Donna Christen MD   Signed by:   Donna Christen MD on 05/28/2010   Method used:   Print then Give to Patient   RxID:   [id number] PREDNISONE 10 MG TABS (PREDNISONE) 2 PO today, then 2 BID for 2 days, then 1 two times a day for 2 days, then 1 daily for 2 days.  Take PC  #16 x 0   Entered and Authorized by:   Donna Christen MD   Signed by:   Donna Christen MD on  05/28/2010   Method used:   Print then Give to Patient   RxID:   [id number] OMPERAZOLE   #30 x 0   Entered by:   Lavell Islam RN   Authorized by:   Donna Christen MD   Signed by:   Donna Christen MD on 05/28/2010   Method used:   Print then Give to Patient   RxID:   [id number]   Orders Added: 1)  New Patient Level III [25956] 2)  Services provided After hours-Weekends-Holidays [38756]

## 2011-07-27 ENCOUNTER — Encounter: Payer: Self-pay | Admitting: Emergency Medicine

## 2011-07-27 ENCOUNTER — Emergency Department
Admission: EM | Admit: 2011-07-27 | Discharge: 2011-07-27 | Disposition: A | Discharge status: Home / Self Care | Payer: 59 | Source: Home / Self Care

## 2011-07-27 DIAGNOSIS — S91309A Unspecified open wound, unspecified foot, initial encounter: Secondary | ICD-10-CM

## 2011-07-27 DIAGNOSIS — IMO0002 Reserved for concepts with insufficient information to code with codable children: Secondary | ICD-10-CM

## 2011-07-27 NOTE — ED Provider Notes (Signed)
History     CSN: [id number]  Arrival date & time 07/27/11  9147   First MD Initiated Contact with Patient 07/27/11 1900      Chief Complaint  Patient presents with  . Foreign Body in Skin   HPI Comments: Pt's husband broke [place] plate.  Cleaned area up.  Pt walked over area, felt like she stepped on something.  Pt realized she had piece of [place] stuck in foot.   Patient is a 56 y.o. female presenting with foreign body.  Foreign Body  The current episode started 6 to 12 hours ago. Intake: R foot  Suspected object: piece of broken [place].    Past Medical History  Diagnosis Date  . Diabetes mellitus   . Hyperlipidemia   . Hypertension   . Abnormal breast exam     chip in right breast as marker for abnormality     Past Surgical History  Procedure Date  . Abdominal hysterectomy   . Kidney stone surgery 2010  . Breast lumpectomy 09/28/10    right breast     Family History  Problem Relation Age of Onset  . Diabetes Mother   . Lung cancer Father   . Cancer Father 74    lung cancer   . Lung cancer Paternal Grandmother   . Cancer Paternal Grandfather     History  Substance Use Topics  . Smoking status: Never Smoker   . Smokeless tobacco: Not on file  . Alcohol Use: No    OB History    Grav Para Term Preterm Abortions TAB SAB Ect Mult Living                  Review of Systems  All other systems reviewed and are negative.    Allergies  Omeprazole; Morphine; Oxycodone-acetaminophen; and Morphine and related  Home Medications   Current Outpatient Rx  Name Route Sig Dispense Refill  . ESTRADIOL 0.5 MG PO TABS Oral Take 1.5 mg by mouth daily.     Marland Kitchen GLIMEPIRIDE 4 MG PO TABS Oral Take 4.5 mg by mouth daily before breakfast.     . LISINOPRIL-HYDROCHLOROTHIAZIDE 10-12.5 MG PO TABS Oral Take 1 tablet by mouth daily.      Marland Kitchen METFORMIN HCL ER (MOD) 1000 MG PO TB24 Oral Take 1,000 mg by mouth daily with breakfast.      . MULTI-VITAMIN/MINERALS PO TABS Oral Take 1  tablet by mouth daily.      Marland Kitchen POTASSIUM CITRATE ER 10 MEQ (1080 MG) PO TBCR Oral Take 10 mEq by mouth 4 (four) times daily.      Marland Kitchen SIMVASTATIN 40 MG PO TABS Oral Take 40 mg by mouth at bedtime.      Marland Kitchen VITAMIN D (ERGOCALCIFEROL) PO Oral Take by mouth.        BP 138/81  Pulse 79  Temp 98.2 F (36.8 C) (Oral)  Resp 16  Ht 5\' 7"  (1.702 m)  Wt 209 lb (94.802 kg)  BMI 32.73 kg/m2  SpO2 95%  Physical Exam  Constitutional: She appears well-developed and well-nourished.  HENT:  Head: Normocephalic and atraumatic.  Eyes: Conjunctivae are normal. Pupils are equal, round, and reactive to light.  Neck: Normal range of motion. Neck supple.  Cardiovascular: Normal rate and regular rhythm.   Pulmonary/Chest: Effort normal and breath sounds normal.  Musculoskeletal: Normal range of motion.       Feet:       SKIN:  <0.5 cm laceration on medial aspect of R  great toe.    Neurological: She is alert.  Skin: Skin is warm.    ED Course  Procedures (including critical care time)  Labs Reviewed - No data to display No results found.   No diagnosis found.    MDM  Approx 1 cm shard of [place] removed from laceration without incident.  Minimal bleeding.  Area cleansed.  Placed topical antibiotic and redressed.  Discussed infectious red flags.  Handout given.  Follow up as needed.     The patient and/or caregiver has been counseled thoroughly with regard to treatment plan and/or medications prescribed including dosage, schedule, interactions, rationale for use, and possible side effects and they verbalize understanding. Diagnoses and expected course of recovery discussed and will return if not improved as expected or if the condition worsens. Patient and/or caregiver verbalized understanding.             Floydene Flock, MD 07/28/11 [phone]

## 2011-08-03 NOTE — ED Provider Notes (Signed)
Agree with exam, assessment, and plan.   Stephen A Beese, MD 08/03/11 0910 

## 2011-09-12 ENCOUNTER — Ambulatory Visit (HOSPITAL_COMMUNITY)
Admission: RE | Admit: 2011-09-12 | Discharge: 2011-09-12 | Disposition: A | Discharge status: Home / Self Care | Payer: 59 | Source: Ambulatory Visit | Attending: Cardiology | Admitting: Cardiology

## 2011-09-12 DIAGNOSIS — R011 Cardiac murmur, unspecified: Secondary | ICD-10-CM | POA: Insufficient documentation

## 2011-09-12 DIAGNOSIS — E119 Type 2 diabetes mellitus without complications: Secondary | ICD-10-CM | POA: Insufficient documentation

## 2011-09-12 DIAGNOSIS — E785 Hyperlipidemia, unspecified: Secondary | ICD-10-CM | POA: Insufficient documentation

## 2011-09-12 DIAGNOSIS — I10 Essential (primary) hypertension: Secondary | ICD-10-CM | POA: Insufficient documentation

## 2011-09-12 NOTE — Progress Notes (Signed)
  Echocardiogram 2D Echocardiogram has been performed.  Dirk Vanaman 09/12/2011, 11:05 AM

## 2011-09-19 ENCOUNTER — Ambulatory Visit (HOSPITAL_COMMUNITY): Admission: RE | Admit: 2011-09-19 | Payer: 59 | Source: Ambulatory Visit

## 2012-08-06 ENCOUNTER — Other Ambulatory Visit (HOSPITAL_COMMUNITY): Payer: Self-pay | Admitting: Obstetrics and Gynecology

## 2012-08-06 DIAGNOSIS — R1010 Upper abdominal pain, unspecified: Secondary | ICD-10-CM

## 2012-08-06 DIAGNOSIS — K219 Gastro-esophageal reflux disease without esophagitis: Secondary | ICD-10-CM

## 2012-08-08 ENCOUNTER — Ambulatory Visit (HOSPITAL_COMMUNITY)
Admission: RE | Admit: 2012-08-08 | Discharge: 2012-08-08 | Disposition: A | Discharge status: Home / Self Care | Payer: 59 | Source: Ambulatory Visit | Attending: Obstetrics and Gynecology | Admitting: Obstetrics and Gynecology

## 2012-08-08 DIAGNOSIS — K7689 Other specified diseases of liver: Secondary | ICD-10-CM | POA: Insufficient documentation

## 2012-08-08 DIAGNOSIS — R1010 Upper abdominal pain, unspecified: Secondary | ICD-10-CM

## 2012-08-08 DIAGNOSIS — I700 Atherosclerosis of aorta: Secondary | ICD-10-CM | POA: Insufficient documentation

## 2012-08-08 DIAGNOSIS — R109 Unspecified abdominal pain: Secondary | ICD-10-CM | POA: Insufficient documentation

## 2012-08-08 DIAGNOSIS — K219 Gastro-esophageal reflux disease without esophagitis: Secondary | ICD-10-CM | POA: Insufficient documentation

## 2012-09-04 ENCOUNTER — Encounter (HOSPITAL_COMMUNITY): Payer: Self-pay | Admitting: *Deleted

## 2012-09-04 ENCOUNTER — Emergency Department (HOSPITAL_COMMUNITY)
Admission: EM | Admit: 2012-09-04 | Discharge: 2012-09-04 | Disposition: A | Discharge status: Home / Self Care | Payer: 59 | Source: Home / Self Care | Attending: Family Medicine | Admitting: Family Medicine

## 2012-09-04 ENCOUNTER — Emergency Department (INDEPENDENT_AMBULATORY_CARE_PROVIDER_SITE_OTHER): Payer: 59

## 2012-09-04 DIAGNOSIS — W19XXXA Unspecified fall, initial encounter: Secondary | ICD-10-CM

## 2012-09-04 DIAGNOSIS — M25569 Pain in unspecified knee: Secondary | ICD-10-CM

## 2012-09-04 DIAGNOSIS — M25561 Pain in right knee: Secondary | ICD-10-CM

## 2012-09-04 MED ORDER — TRAMADOL HCL 50 MG PO TABS: 50.0000 mg | ORAL_TABLET | Freq: Four times a day (QID) | ORAL | Status: DC | PRN

## 2012-09-04 NOTE — ED Provider Notes (Signed)
Medical screening examination/treatment/procedure(s) were performed by non-physician practitioner and as supervising physician I was immediately available for consultation/collaboration.   [facility]; MD  Sharin Grave, MD 09/04/12 1414

## 2012-09-04 NOTE — ED Notes (Signed)
Pt is here with complaints of fall yesterday with injury to right knee.  Pt reports generalized pain in knee.  Right great toe bruised.

## 2012-09-04 NOTE — ED Provider Notes (Signed)
CSN: [id number]     Arrival date & time 09/04/12  0907 History   First MD Initiated Contact with Patient 09/04/12 [phone]     Chief Complaint  Patient presents with  . Knee Injury   (Consider location/radiation/quality/duration/timing/severity/associated sxs/prior Treatment) HPI Comments: 57 year old female presents for evaluation of right knee pain. Yesterday, she was walking down steps and slipped. Her right leg folded up under her and she had immediate pain in the right knee. the pain is diffuse across the lower knee and the knee is swollen. She has difficulty extending the knee all the way. The knee is popping and catching as well. she also has some pain and bruising in the right great toe and the right upper arm but these last 2 have mostly resolved with topical Aspercreme. Denies any systemic symptoms.   Past Medical History  Diagnosis Date  . Diabetes mellitus   . Hyperlipidemia   . Hypertension   . Abnormal breast exam     chip in right breast as marker for abnormality    Past Surgical History  Procedure Laterality Date  . Abdominal hysterectomy    . Kidney stone surgery  2010  . Breast lumpectomy  09/28/10    right breast    Family History  Problem Relation Age of Onset  . Diabetes Mother   . Lung cancer Father   . Cancer Father 51    lung cancer   . Lung cancer Paternal Grandmother   . Cancer Paternal Grandfather    History  Substance Use Topics  . Smoking status: Never Smoker   . Smokeless tobacco: Not on file  . Alcohol Use: No   OB History   Grav Para Term Preterm Abortions TAB SAB Ect Mult Living                 Review of Systems  Constitutional: Negative for fever and chills.  Eyes: Negative for visual disturbance.  Respiratory: Negative for cough and shortness of breath.   Cardiovascular: Negative for chest pain, palpitations and leg swelling.  Gastrointestinal: Negative for nausea, vomiting and abdominal pain.  Endocrine: Negative for polydipsia and  polyuria.  Genitourinary: Negative for dysuria, urgency and frequency.  Musculoskeletal:       See history of present illness  Skin: Negative for rash.  Neurological: Negative for dizziness, weakness and light-headedness.    Allergies  Omeprazole; Morphine; Oxycodone-acetaminophen; and Morphine and related  Home Medications   Current Outpatient Rx  Name  Route  Sig  Dispense  Refill  . estradiol (ESTRACE) 0.5 MG tablet   Oral   Take 1.5 mg by mouth daily.          Marland Kitchen glimepiride (AMARYL) 4 MG tablet   Oral   Take 4.5 mg by mouth daily before breakfast.          . lisinopril-hydrochlorothiazide (PRINZIDE,ZESTORETIC) 10-12.5 MG per tablet   Oral   Take 1 tablet by mouth daily.           . metFORMIN (GLUMETZA) 1000 MG (MOD) 24 hr tablet   Oral   Take 1,000 mg by mouth daily with breakfast.           . Multiple Vitamins-Minerals (MULTIVITAMIN WITH MINERALS) tablet   Oral   Take 1 tablet by mouth daily.           . potassium citrate (UROCIT-K) 10 MEQ (1080 MG) SR tablet   Oral   Take 10 mEq by mouth 4 (four) times daily.           Marland Kitchen  simvastatin (ZOCOR) 40 MG tablet   Oral   Take 40 mg by mouth at bedtime.           . traMADol (ULTRAM) 50 MG tablet   Oral   Take 1 tablet (50 mg total) by mouth every 6 (six) hours as needed for pain.   30 tablet   0   . VITAMIN D, ERGOCALCIFEROL, PO   Oral   Take by mouth.            BP 156/76  Pulse 80  Temp(Src) 98 F (36.7 C) (Oral)  Resp 20  SpO2 98% Physical Exam  Constitutional: She is oriented to person, place, and time. She appears well-developed and well-nourished. No distress.  HENT:  Head: Normocephalic.  Pulmonary/Chest: Effort normal. No respiratory distress.  Musculoskeletal:       Right knee: She exhibits effusion (Noted across the inferior knee bilaterally ) and bony tenderness (tibial plateau). She exhibits no deformity, no erythema, normal alignment, no LCL laxity, normal patellar mobility,  normal meniscus and no MCL laxity. Tenderness found. Medial joint line, lateral joint line, MCL, LCL and patellar tendon tenderness noted.       Feet:  Neurological: She is alert and oriented to person, place, and time. No cranial nerve deficit or sensory deficit. She exhibits normal muscle tone. Coordination normal.  Skin: Skin is warm and dry. No rash noted. She is not diaphoretic.  Psychiatric: She has a normal mood and affect. Judgment normal.    ED Course  Procedures (including critical care time) Labs Review Labs Reviewed - No data to display Imaging Review Dg Knee Complete 4 Views Right  09/04/2012   CLINICAL DATA:  57 year old female status post fall. Pain.  EXAM: RIGHT KNEE - COMPLETE 4+ VIEW  COMPARISON:  None.  FINDINGS: Advanced degenerative changes. Severe patellofemoral joint space loss. Osteophytosis and possible suprapatellar calcified loose body (images 3 and 4). Trace if any joint effusion. Medial and lateral compartment degenerative spurring. Anterior superficial soft tissue stranding and stranding in Hoffa's fat pad. No acute fracture.  IMPRESSION: Age advanced degenerative changes at the right knee. No acute fracture. Small if any joint effusion.   Electronically Signed   By: Augusto Gamble   On: 09/04/2012 10:32    MDM   1. Knee pain, right anterior   2. Fall, initial encounter    No radiographic evidence of fracture. There is no obvious ligamentous laxity at this time either. However, given age and mechanism, I will have her followup with orthopedics. She is agreeable with this plan. She will use an ace wrap brace she has at home for compression and take tramadol as needed for pain.   Meds ordered this encounter  Medications  . traMADol (ULTRAM) 50 MG tablet    Sig: Take 1 tablet (50 mg total) by mouth every 6 (six) hours as needed for pain.    Dispense:  30 tablet    Refill:  0     Graylon Good, PA-C 09/04/12 1057

## 2012-09-24 ENCOUNTER — Encounter: Payer: Self-pay | Admitting: Gastroenterology

## 2012-10-28 ENCOUNTER — Ambulatory Visit (INDEPENDENT_AMBULATORY_CARE_PROVIDER_SITE_OTHER): Payer: 59 | Admitting: Gastroenterology

## 2012-10-28 ENCOUNTER — Encounter: Payer: Self-pay | Admitting: Gastroenterology

## 2012-10-28 VITALS — BP 148/86 | HR 80 | Ht 66.5 in | Wt 207.8 lb

## 2012-10-28 DIAGNOSIS — R1314 Dysphagia, pharyngoesophageal phase: Secondary | ICD-10-CM

## 2012-10-28 DIAGNOSIS — G8929 Other chronic pain: Secondary | ICD-10-CM

## 2012-10-28 DIAGNOSIS — R1013 Epigastric pain: Secondary | ICD-10-CM

## 2012-10-28 MED ORDER — ESOMEPRAZOLE MAGNESIUM 40 MG PO CPDR: 40.0000 mg | DELAYED_RELEASE_CAPSULE | Freq: Every day | ORAL | Status: DC

## 2012-10-28 NOTE — Patient Instructions (Signed)
You will be set up for an upper endoscopy for dysphagia, epigastric pains (LEC, moderate sedation). nexium was called into your pharmacy.

## 2012-10-28 NOTE — Progress Notes (Signed)
HPI: Emma is a   very pleasant 57 year old Duran whom I am meeting for the first time today.  She had a colonoscopy with Dr. Ewing Schlein in July 2006 for routine screening. He described hemorrhoids, diverticulosis and some small polyps which end up being hyperplastic on pathology.  She was told by letter that the polyp was adenomatous however and that she need repeat colonoscopy in 5 years.  abd [place] 8/20014 was essentially normal  Recent cardiologty workup with Dr. Anne Fu (including stress tests) were all normal.  Epigastric post prandial pains; even at night occasionally.  Usually associated with nausea.  Acid taste in AM. Nexium has helped in the past.  PPI has also helped with epigastric pains, post prandially.    Never had EGD.  + 6 pounds weight loss, walking and eating better  Intermittent solid food dysphagia, Emma is associated with what sounds like odynophagia as well.    Review of systems: Pertinent positive and negative review of systems were noted in the above HPI section. Complete review of systems was performed and was otherwise normal.    Past Medical History  Diagnosis Date  . Diabetes mellitus   . Hyperlipidemia   . Hypertension   . Abnormal breast exam     chip in right breast as marker for abnormality   . History of colon polyps   . Internal hemorrhoids   . External hemorrhoids     Past Surgical History  Procedure Laterality Date  . Abdominal hysterectomy    . Kidney stone surgery  2010  . Breast lumpectomy  09/28/10    right breast     Current Outpatient Prescriptions  Medication Sig Dispense Refill  . estradiol (ESTRACE) 0.5 MG tablet Take 1.5 mg by mouth daily.       Marland Kitchen glimepiride (AMARYL) 4 MG tablet Take 4.5 mg by mouth daily before breakfast.       . lisinopril-hydrochlorothiazide (PRINZIDE,ZESTORETIC) 10-12.5 MG per tablet Take 1 tablet by mouth daily.        . metFORMIN (GLUMETZA) 1000 MG (MOD) 24 hr tablet Take 1,000 mg by mouth daily with  breakfast.        . potassium citrate (UROCIT-K) 10 MEQ (1080 MG) SR tablet Take 10 mEq by mouth 4 (four) times daily.        . simvastatin (ZOCOR) 40 MG tablet Take 40 mg by mouth at bedtime.        Marland Kitchen VITAMIN D, ERGOCALCIFEROL, PO Take by mouth.         No current facility-administered medications for Emma visit.    Allergies as of 10/28/2012 - Review Complete 10/28/2012  Allergen Reaction Noted  . Omeprazole  09/08/2010  . Morphine  05/28/2010  . Oxycodone-acetaminophen  05/28/2010  . Morphine and related Anxiety 08/30/2010    Family History  Problem Relation Age of Onset  . Diabetes Mother   . Lung cancer Father   . Cancer Father 31    lung cancer   . Lung cancer Paternal Grandmother   . Cancer Paternal Grandfather   . Hypertension      History   Social History  . Marital Status: Married    Spouse Name: N/A    Number of Children: 1  . Years of Education: N/A   Occupational History  . OFFICE MANAGER    Social History Main Topics  . Smoking status: Never Smoker   . Smokeless tobacco: Never Used  . Alcohol Use: No  . Drug Use: No  .  Sexual Activity: Not on file   Other Topics Concern  . Not on file   Social History Narrative  . No narrative on file       Physical Exam: BP 148/86  Pulse 80  Ht 5' 6.5" (1.689 m)  Wt 207 lb 12.8 oz (94.257 kg)  BMI 33.04 kg/m2 Constitutional: generally well-appearing Psychiatric: alert and oriented x3 Eyes: extraocular movements intact Mouth: oral pharynx moist, no lesions Neck: supple no lymphadenopathy Cardiovascular: heart regular rate and rhythm Lungs: clear to auscultation bilaterally Abdomen: soft, nontender, nondistended, no obvious ascites, no peritoneal signs, normal bowel sounds Extremities: no lower extremity edema bilaterally Skin: no lesions on visible extremities    Assessment and plan: 57 y.o. female with  intermittent dysphasia, postprandial abdominal discomfort   I did not mention above but  she takes about 500 TUMS pills per month. I think she clearly has an issue with acid regurg and possibly peptic ulcer disease. She takes 500 mg of naproxen twice daily. NSAIDs can be clearly playing a role as well her symptoms. Nexium has helped her GI symptoms in the past I'm giving her a prescription for Emma, she'll take 1 pill once daily every morning. I'm also going to proceed with EGD at her soonest convenience check for peptic ulcer disease, H. pylori infection, stricturing of her esophagus and we'll plan to dilate any strictures are noted.

## 2012-10-29 ENCOUNTER — Telehealth: Payer: Self-pay | Admitting: Gastroenterology

## 2012-10-29 MED ORDER — PANTOPRAZOLE SODIUM 40 MG PO TBEC: 40.0000 mg | DELAYED_RELEASE_TABLET | Freq: Every day | ORAL | Status: DC

## 2012-10-29 NOTE — Telephone Encounter (Signed)
protonix was sent per pt request allergies and medications will be updated

## 2012-11-08 ENCOUNTER — Encounter: Payer: Self-pay | Admitting: Gastroenterology

## 2012-11-08 ENCOUNTER — Ambulatory Visit (AMBULATORY_SURGERY_CENTER): Payer: 59 | Admitting: Gastroenterology

## 2012-11-08 VITALS — BP 117/77 | HR 74 | Temp 97.3°F | Resp 23 | Ht 66.5 in | Wt 207.0 lb

## 2012-11-08 DIAGNOSIS — K2990 Gastroduodenitis, unspecified, without bleeding: Secondary | ICD-10-CM

## 2012-11-08 DIAGNOSIS — K2970 Gastritis, unspecified, without bleeding: Secondary | ICD-10-CM

## 2012-11-08 DIAGNOSIS — R1013 Epigastric pain: Secondary | ICD-10-CM

## 2012-11-08 DIAGNOSIS — K222 Esophageal obstruction: Secondary | ICD-10-CM

## 2012-11-08 DIAGNOSIS — K219 Gastro-esophageal reflux disease without esophagitis: Secondary | ICD-10-CM

## 2012-11-08 DIAGNOSIS — R1314 Dysphagia, pharyngoesophageal phase: Secondary | ICD-10-CM | POA: Insufficient documentation

## 2012-11-08 LAB — GLUCOSE, CAPILLARY: Glucose-Capillary: 114 mg/dL — ABNORMAL HIGH (ref 70–99)

## 2012-11-08 MED ORDER — SODIUM CHLORIDE 0.9 % IV SOLN: 500.0000 mL | INTRAVENOUS | Status: DC

## 2012-11-08 NOTE — Op Note (Signed)
[facility] [address] [place], [address]   ENDOSCOPY PROCEDURE REPORT  PATIENT: Emma Duran, Emma Duran  MR#: [id number] BIRTHDATE: 1955/07/08 , 57  yrs. old GENDER: Female ENDOSCOPIST: Rachael Fee, MD REFERRED BY:  Lupita Raider, M.D. PROCEDURE DATE:  11/08/2012 PROCEDURE:  EGD w/ biopsy, EGD with balloon dilation ASA CLASS:     Class II INDICATIONS:  GERD, intermittent dysphagia. MEDICATIONS: Fentanyl 50 mcg IV, Versed 6 mg IV, and These medications were titrated to patient response per physician's verbal order TOPICAL ANESTHETIC: Cetacaine Spray  DESCRIPTION OF PROCEDURE: After the risks benefits and alternatives of the procedure were thoroughly explained, informed consent was obtained.  The LB [id number] endoscope was introduced through the mouth and advanced to the second portion of the duodenum. Without limitations.  The instrument was slowly withdrawn as the mucosa was fully examined.    There was a thin Schatzki's type ring at GE junction.  This was dilated with CRE TTS balloon, inflated to 20mm.  There was the typical superficial mucosal tear and small amount of self limited oozing following dilation.  There was moderate pan-gastritis, biopsies taken from distal stomach.  There was small amount of retained liquid food without anatomic outlet obstruction.  The examination was otherwise normal.  Retroflexed views revealed no abnormalities.     The scope was then withdrawn from the patient and the procedure completed. COMPLICATIONS: There were no complications. ENDOSCOPIC IMPRESSION: There was a thin Schatzki's type ring at GE junction.  This was dilated with CRE TTS balloon, inflated to 20mm.  There was the typical superficial mucosal tear and small amount of self limited oozing following dilation.  There was moderate pan-gastritis, biopsies taken from distal stomach.  There was small amount of retained liquid food without anatomic outlet  obstruction.  The examination was otherwise normal.  RECOMMENDATIONS: You should continue taking nexium, one pill once daily.  You should continue to cut back on NSAID type pain medicines.  If the biopsies show H.  pylori, you will be started on appropriate antibiotics.   eSigned:  Rachael Fee, MD 11/08/2012 3:17 PM

## 2012-11-08 NOTE — Progress Notes (Signed)
Patient did not experience any of the following events: a burn prior to discharge; a fall within the facility; wrong site/side/patient/procedure/implant event; or a hospital transfer or hospital admission upon discharge from the facility. (G8907)Patient did not have preoperative order for IV antibiotic SSI prophylaxis. (G8918) ewm 

## 2012-11-08 NOTE — Patient Instructions (Signed)
YOU HAD AN ENDOSCOPIC PROCEDURE TODAY AT THE [facility]: Refer to the procedure report that was given to you for any specific questions about what was found during the examination.  If the procedure report does not answer your questions, please call your gastroenterologist to clarify.  If you requested that your care partner not be given the details of your procedure findings, then the procedure report has been included in a sealed envelope for you to review at your convenience later.  YOU SHOULD EXPECT: Some feelings of bloating in the abdomen. Passage of more gas than usual.  Walking can help get rid of the air that was put into your GI tract during the procedure and reduce the bloating. If you had a lower endoscopy (such as a colonoscopy or flexible sigmoidoscopy) you may notice spotting of blood in your stool or on the toilet paper. If you underwent a bowel prep for your procedure, then you may not have a normal bowel movement for a few days.  DIET:  Please follow the dilation diet for today. You may resume your regular diet tomorrow morning. Drink plenty of fluids but you should avoid alcoholic beverages for 24 hours.  ACTIVITY: Your care partner should take you home directly after the procedure.  You should plan to take it easy, moving slowly for the rest of the day.  You can resume normal activity the day after the procedure however you should NOT DRIVE or use heavy machinery for 24 hours (because of the sedation medicines used during the test).    SYMPTOMS TO REPORT IMMEDIATELY: A gastroenterologist can be reached at any hour.  During normal business hours, 8:30 AM to 5:00 PM Monday through Friday, call [phone].  After hours and on weekends, please call the GI answering service at [phone]  Emergency number who will take a message and have the physician on call contact you.   Vomiting of blood or coffee ground material  New chest pain or pain under the shoulder  blades  Painful or persistently difficult swallowing  New shortness of breath  Fever of 100F or higher  Black, tarry-looking stools  FOLLOW UP: If any biopsies were taken you will be contacted by phone or by letter within the next 1-3 weeks.  Call your gastroenterologist if you have not heard about the biopsies in 3 weeks.  Our staff will call the home number listed on your records the next business day following your procedure to check on you and address any questions or concerns that you may have at that time regarding the information given to you following your procedure. This is a courtesy call and so if there is no answer at the home number and we have not heard from you through the emergency physician on call, we will assume that you have returned to your regular daily activities without incident.  SIGNATURES/CONFIDENTIALITY: You and/or your care partner have signed paperwork which will be entered into your electronic medical record.  These signatures attest to the fact that that the information above on your After Visit Summary has been reviewed and is understood.  Full responsibility of the confidentiality of this discharge information lies with you and/or your care-partner.  You should continue the nexium once a day. This medicine works best if taken 20-30 minutes before your first meal of the day.  You should continue to cut back on the NSAID pain medicines.

## 2012-11-11 ENCOUNTER — Telehealth: Payer: Self-pay

## 2012-11-11 NOTE — Telephone Encounter (Signed)
  Follow up Call-  Call back number 11/08/2012  Post procedure Call Back phone  # [phone]  Permission to leave phone message Yes     Patient questions:  Do you have a fever, pain , or abdominal swelling? no Pain Score  0 *  Have you tolerated food without any problems? yes  Have you been able to return to your normal activities? yes  Do you have any questions about your discharge instructions: Diet   no Medications  no Follow up visit  no  Do you have questions or concerns about your Care? no  Actions: * If pain score is 4 or above: No action needed, pain <4.

## 2012-11-13 ENCOUNTER — Encounter: Payer: Self-pay | Admitting: Gastroenterology

## 2012-12-07 ENCOUNTER — Emergency Department (HOSPITAL_BASED_OUTPATIENT_CLINIC_OR_DEPARTMENT_OTHER): Payer: 59

## 2012-12-07 ENCOUNTER — Emergency Department (HOSPITAL_BASED_OUTPATIENT_CLINIC_OR_DEPARTMENT_OTHER)
Admission: EM | Admit: 2012-12-07 | Discharge: 2012-12-07 | Disposition: A | Discharge status: Home / Self Care | Payer: 59 | Attending: Emergency Medicine | Admitting: Emergency Medicine

## 2012-12-07 ENCOUNTER — Encounter (HOSPITAL_BASED_OUTPATIENT_CLINIC_OR_DEPARTMENT_OTHER): Payer: Self-pay | Admitting: Emergency Medicine

## 2012-12-07 DIAGNOSIS — Z79899 Other long term (current) drug therapy: Secondary | ICD-10-CM | POA: Insufficient documentation

## 2012-12-07 DIAGNOSIS — R209 Unspecified disturbances of skin sensation: Secondary | ICD-10-CM | POA: Insufficient documentation

## 2012-12-07 DIAGNOSIS — E119 Type 2 diabetes mellitus without complications: Secondary | ICD-10-CM | POA: Insufficient documentation

## 2012-12-07 DIAGNOSIS — G459 Transient cerebral ischemic attack, unspecified: Secondary | ICD-10-CM | POA: Insufficient documentation

## 2012-12-07 DIAGNOSIS — I10 Essential (primary) hypertension: Secondary | ICD-10-CM | POA: Insufficient documentation

## 2012-12-07 DIAGNOSIS — Z7982 Long term (current) use of aspirin: Secondary | ICD-10-CM | POA: Insufficient documentation

## 2012-12-07 DIAGNOSIS — Z860100 Personal history of colon polyps, unspecified: Secondary | ICD-10-CM | POA: Insufficient documentation

## 2012-12-07 DIAGNOSIS — R200 Anesthesia of skin: Secondary | ICD-10-CM

## 2012-12-07 DIAGNOSIS — Z8742 Personal history of other diseases of the female genital tract: Secondary | ICD-10-CM | POA: Insufficient documentation

## 2012-12-07 LAB — CBC WITH DIFFERENTIAL/PLATELET
Basophils Relative: 1 % (ref 0–1)
Eosinophils Relative: 2 % (ref 0–5)
HCT: 42.6 % (ref 36.0–46.0)
Hemoglobin: 14.4 g/dL (ref 12.0–15.0)
Lymphocytes Relative: 43 % (ref 12–46)
Lymphs Abs: 4.5 10*3/uL — ABNORMAL HIGH (ref 0.7–4.0)
MCHC: 33.8 g/dL (ref 30.0–36.0)
MCV: 92.4 fL (ref 78.0–100.0)
Monocytes Absolute: 0.8 10*3/uL (ref 0.1–1.0)
Monocytes Relative: 7 % (ref 3–12)
Neutro Abs: 5.1 10*3/uL (ref 1.7–7.7)
Neutrophils Relative %: 48 % (ref 43–77)
RBC: 4.61 MIL/uL (ref 3.87–5.11)
WBC: 10.7 10*3/uL — ABNORMAL HIGH (ref 4.0–10.5)

## 2012-12-07 LAB — COMPREHENSIVE METABOLIC PANEL
ALT: 45 U/L — ABNORMAL HIGH (ref 0–35)
Alkaline Phosphatase: 56 U/L (ref 39–117)
CO2: 22 mEq/L (ref 19–32)
Chloride: 95 mEq/L — ABNORMAL LOW (ref 96–112)
Creatinine, Ser: 0.8 mg/dL (ref 0.50–1.10)
GFR calc Af Amer: >90 mL/min (ref >90)
GFR calc non Af Amer: 80 mL/min — ABNORMAL LOW (ref >90)
Glucose, Bld: 210 mg/dL — ABNORMAL HIGH (ref 70–99)
Potassium: 3.7 mEq/L (ref 3.5–5.1)
Sodium: 137 mEq/L (ref 135–145)
Total Bilirubin: 0.5 mg/dL (ref 0.3–1.2)

## 2012-12-07 LAB — TROPONIN I: Troponin I: <0.3 ng/mL (ref <0.30)

## 2012-12-07 MED ORDER — CLOPIDOGREL BISULFATE 75 MG PO TABS: 75.0000 mg | ORAL_TABLET | Freq: Every day | ORAL | Status: DC

## 2012-12-07 NOTE — ED Notes (Signed)
Left sided arm numbness since Wednesday.  Intermittent facial numbness since Wednesday.  Pt states left foot was getting numb last night.

## 2012-12-07 NOTE — ED Provider Notes (Signed)
CSN: [id number]     Arrival date & time 12/07/12  1820 History  This chart was scribed for Roney Marion, MD by Bennett Scrape, ED Scribe. This patient was seen in room MH12/MH12 and the patient's care was started at 7:15 PM.    Chief Complaint  Patient presents with  . Numbness    The history is provided by the patient. No language interpreter was used.    HPI Comments: Emma Duran is a 57 y.o. female who presents to the Emergency Department complaining of intermittent left facial numbness mostly around the left nare with associated left arm numbness for the past 4 days with associated fatigue. The longest episode lasted about 3 hours. She denies any involvement with her neck or legs. She denies having any symptoms currently. She denies any changes in ADLs such as eating or brushing her teeth during the symptoms. She reports that her left hand was briefly numb last month but she otherwise denies any prior episodes. She reports that she was recently changed to Invokana in conjunction with continued metformin use and pt states that she read that tingling is a side effect. She called the [employer] and was told that she should not take any other medications with it. She called her PCP's nurse and was told to discontinue the Invokaka until her next appointment. She denies taking it in the past 2 days. She denies that her CBGs have been uncontrolled stating that her PCP wants her to try new therapies "for no reason". She has a h/o HTN, HLD and DM treated with oral medications. She denies any h/o cardiac or pulmonary conditions and states that she had a normal stress within the past 5 years. She denies any US of the arteries. She denies a family h/o CAD or CVA. She denies any h/o smoking.    She also reports left foot cramping that started this morning. She states that she has a h/o the same in the right which she can stretch out. She reports rubbing cream on the area with improvement. She denies  any calcium deficiencies.     Past Medical History  Diagnosis Date  . Diabetes mellitus   . Hyperlipidemia   . Hypertension   . Abnormal breast exam     chip in right breast as marker for abnormality   . History of colon polyps   . Internal hemorrhoids   . External hemorrhoids    Past Surgical History  Procedure Laterality Date  . Abdominal hysterectomy    . Kidney stone surgery  2010  . Breast lumpectomy  09/28/10    right breast    Family History  Problem Relation Age of Onset  . Diabetes Mother   . Lung cancer Father   . Cancer Father 25    lung cancer   . Lung cancer Paternal Grandmother   . Cancer Paternal Grandfather   . Hypertension    . Colon cancer Neg Hx   . Stomach cancer Neg Hx   . Esophageal cancer Neg Hx    History  Substance Use Topics  . Smoking status: Never Smoker   . Smokeless tobacco: Never Used  . Alcohol Use: No   No OB history provided.  Review of Systems  Constitutional: Negative for fever, chills, diaphoresis, appetite change and fatigue.  HENT: Negative for drooling, mouth sores, sore throat and trouble swallowing.   Eyes: Negative for visual disturbance.  Respiratory: Negative for cough, chest tightness, shortness of breath and wheezing.  Cardiovascular: Negative for chest pain.  Gastrointestinal: Negative for nausea, vomiting, abdominal pain, diarrhea and abdominal distention.  Endocrine: Negative for polydipsia, polyphagia and polyuria.  Genitourinary: Negative for dysuria, frequency and hematuria.  Musculoskeletal: Negative for gait problem.  Skin: Negative for color change, pallor and rash.  Neurological: Positive for numbness. Negative for dizziness, syncope, weakness, light-headedness and headaches.  Hematological: Does not bruise/bleed easily.  Psychiatric/Behavioral: Negative for behavioral problems and confusion.    Allergies  Omeprazole; Morphine; Tramadol; and Morphine and related  Home Medications   Current  Outpatient Rx  Name  Route  Sig  Dispense  Refill  . aspirin 81 MG tablet   Oral   Take 81 mg by mouth daily.         . Canagliflozin (INVOKANA) 300 MG TABS   Oral   Take by mouth.         . estradiol (ESTRACE) 0.5 MG tablet   Oral   Take 1 mg by mouth daily.          Marland Kitchen glimepiride (AMARYL) 4 MG tablet   Oral   Take 4 mg by mouth daily before breakfast.          . lisinopril-hydrochlorothiazide (PRINZIDE,ZESTORETIC) 10-12.5 MG per tablet   Oral   Take 1 tablet by mouth daily.           . metFORMIN (GLUMETZA) 1000 MG (MOD) 24 hr tablet   Oral   Take 1,000 mg by mouth 2 (two) times daily with a meal.          . naproxen (NAPROSYN) 500 MG tablet   Oral   Take 500 mg by mouth 2 (two) times daily with a meal.         . pantoprazole (PROTONIX) 40 MG tablet   Oral   Take 1 tablet (40 mg total) by mouth daily.   90 tablet   3   . potassium citrate (UROCIT-K) 10 MEQ (1080 MG) SR tablet   Oral   Take 10 mEq by mouth 6 (six) times daily.          Marland Kitchen VITAMIN D, ERGOCALCIFEROL, PO   Oral   Take 2,000 Int'l Units by mouth daily.          . clopidogrel (PLAVIX) 75 MG tablet   Oral   Take 1 tablet (75 mg total) by mouth daily with breakfast.   30 tablet   0   . Magnesium 250 MG TABS   Oral   Take by mouth. 1 time daily          Triage Vitals: BP 136/74  Pulse 89  Temp(Src) 98.1 F (36.7 C) (Oral)  Resp 18  Ht 5\' 7"  (1.702 m)  Wt 210 lb (95.255 kg)  BMI 32.88 kg/m2  SpO2 100%  Physical Exam  Nursing note and vitals reviewed. Constitutional: She is oriented to person, place, and time. She appears well-developed and well-nourished. No distress.  HENT:  Head: Normocephalic and atraumatic.  Eyes: Conjunctivae are normal. Pupils are equal, round, and reactive to light. No scleral icterus.  Neck: Normal range of motion. Neck supple. Carotid bruit is not present. No thyromegaly present.  Cardiovascular: Normal rate and regular rhythm.  Exam reveals  no gallop and no friction rub.   No murmur heard. Pulmonary/Chest: Effort normal and breath sounds normal. No respiratory distress. She has no wheezes. She has no rales.  Abdominal: Soft. Bowel sounds are normal. She exhibits no distension. There is no tenderness.  There is no rebound.  Musculoskeletal: Normal range of motion.  Neurological: She is alert and oriented to person, place, and time.  Skin: Skin is warm and dry. No rash noted.  Psychiatric: She has a normal mood and affect. Her behavior is normal.    ED Course  Procedures (including critical care time)  DIAGNOSTIC STUDIES: Oxygen Saturation is 100% on room air, normal by my interpretation.    COORDINATION OF CARE: 7:20 PM- Pt states that she is now developing tingling along the dorsal aspect of the left hand and is radiating up to her mid forearm.   7:24 PM-Discussed treatment plan which includes researching Invokana with pt at bedside and pt agreed to plan.    Labs Review Labs Reviewed  CBC WITH DIFFERENTIAL - Abnormal; Notable for the following:    WBC 10.7 (*)    Lymphs Abs 4.5 (*)    All other components within normal limits  COMPREHENSIVE METABOLIC PANEL - Abnormal; Notable for the following:    Chloride 95 (*)    Glucose, Bld 210 (*)    ALT 45 (*)    GFR calc non Af Amer 80 (*)    All other components within normal limits  TROPONIN I   Imaging Review Ct Head Wo Contrast  12/07/2012   CLINICAL DATA:  Left facial numbness, headache, vertigo  EXAM: CT HEAD WITHOUT CONTRAST  TECHNIQUE: Contiguous axial images were obtained from the base of the skull through the vertex without intravenous contrast.  COMPARISON:  None.  FINDINGS: No skull fracture is noted. Paranasal sinuses and mastoid air cells are unremarkable. Probable lipoma left frontal scalp measures 2.2 cm.  Mild cerebral atrophy. No intracranial hemorrhage, mass effect or midline shift. The gray and white-matter differentiation is preserved. No intra or  extra-axial fluid collection. No mass lesion is noted on this unenhanced scan. No acute cortical infarction.  IMPRESSION: No acute intracranial abnormality.  Mild cerebral atrophy.   Electronically Signed   By: Natasha Mead M.D.   On: 12/07/2012 20:27    EKG Interpretation   None       MDM   1. Numbness   2. TIA (transient ischemic attack)    The patient continued to have intermittent episodes of numbness. These would only last for a few minutes at a time. Diarrhea examiner had her CT scan she is asymptomatic. She is not show white matter changes as described on CT report. No sign of completed stroke after 4 days of intermittent symptoms. I find no abnormalities on neurological exam. She does have risk for vascular disease. We'll change her from aspirin to Plavix. She'll continue to alternate medication. The timing of her symptoms starting with a new medications do sound somewhat incriminating, however she has been off of it for 3 hours with only minimal improvement. Have asked her to recheck with her physician on Monday. With any evolution of symptoms with weakness vision and speech weakness or any other symptoms in the emergency room over the next several days. I personally performed the services described in this documentation, which was scribed in my presence. The recorded information has been reviewed and is accurate.     Roney Marion, MD 12/07/12 2107

## 2013-08-06 ENCOUNTER — Other Ambulatory Visit: Payer: Self-pay | Admitting: Obstetrics and Gynecology

## 2013-08-07 LAB — CYTOLOGY - PAP

## 2013-09-15 ENCOUNTER — Other Ambulatory Visit (HOSPITAL_COMMUNITY): Payer: Self-pay | Admitting: Orthopedic Surgery

## 2013-09-15 DIAGNOSIS — M25561 Pain in right knee: Secondary | ICD-10-CM

## 2013-09-26 ENCOUNTER — Ambulatory Visit (HOSPITAL_COMMUNITY)
Admission: RE | Admit: 2013-09-26 | Discharge: 2013-09-26 | Disposition: A | Discharge status: Home / Self Care | Payer: 59 | Source: Ambulatory Visit | Attending: Orthopedic Surgery | Admitting: Orthopedic Surgery

## 2013-09-26 DIAGNOSIS — M25569 Pain in unspecified knee: Secondary | ICD-10-CM | POA: Insufficient documentation

## 2013-09-26 DIAGNOSIS — M25561 Pain in right knee: Secondary | ICD-10-CM

## 2013-09-29 ENCOUNTER — Encounter (HOSPITAL_BASED_OUTPATIENT_CLINIC_OR_DEPARTMENT_OTHER)
Admission: RE | Admit: 2013-09-29 | Discharge: 2013-09-29 | Disposition: A | Discharge status: Home / Self Care | Payer: 59 | Source: Ambulatory Visit | Attending: Orthopedic Surgery | Admitting: Orthopedic Surgery

## 2013-09-29 ENCOUNTER — Other Ambulatory Visit: Payer: Self-pay | Admitting: Orthopedic Surgery

## 2013-09-29 ENCOUNTER — Encounter (HOSPITAL_BASED_OUTPATIENT_CLINIC_OR_DEPARTMENT_OTHER): Payer: Self-pay | Admitting: *Deleted

## 2013-09-29 DIAGNOSIS — E119 Type 2 diabetes mellitus without complications: Secondary | ICD-10-CM | POA: Diagnosis not present

## 2013-09-29 DIAGNOSIS — Y929 Unspecified place or not applicable: Secondary | ICD-10-CM | POA: Diagnosis not present

## 2013-09-29 DIAGNOSIS — I10 Essential (primary) hypertension: Secondary | ICD-10-CM | POA: Diagnosis not present

## 2013-09-29 DIAGNOSIS — Z79899 Other long term (current) drug therapy: Secondary | ICD-10-CM | POA: Diagnosis not present

## 2013-09-29 DIAGNOSIS — Z86010 Personal history of colonic polyps: Secondary | ICD-10-CM | POA: Diagnosis not present

## 2013-09-29 DIAGNOSIS — M2240 Chondromalacia patellae, unspecified knee: Secondary | ICD-10-CM | POA: Diagnosis not present

## 2013-09-29 DIAGNOSIS — K219 Gastro-esophageal reflux disease without esophagitis: Secondary | ICD-10-CM | POA: Diagnosis not present

## 2013-09-29 DIAGNOSIS — S83289A Other tear of lateral meniscus, current injury, unspecified knee, initial encounter: Secondary | ICD-10-CM | POA: Diagnosis present

## 2013-09-29 DIAGNOSIS — Z885 Allergy status to narcotic agent status: Secondary | ICD-10-CM | POA: Diagnosis not present

## 2013-09-29 DIAGNOSIS — Z7982 Long term (current) use of aspirin: Secondary | ICD-10-CM | POA: Diagnosis not present

## 2013-09-29 DIAGNOSIS — W19XXXA Unspecified fall, initial encounter: Secondary | ICD-10-CM | POA: Diagnosis not present

## 2013-09-29 DIAGNOSIS — E785 Hyperlipidemia, unspecified: Secondary | ICD-10-CM | POA: Diagnosis not present

## 2013-09-29 LAB — BASIC METABOLIC PANEL
ANION GAP: 15 (ref 5–15)
BUN: 14 mg/dL (ref 6–23)
CHLORIDE: 98 meq/L (ref 96–112)
CO2: 27 meq/L (ref 19–32)
Calcium: 9.9 mg/dL (ref 8.4–10.5)
Creatinine, Ser: 0.61 mg/dL (ref 0.50–1.10)
GFR calc Af Amer: >90 mL/min (ref >90)
GFR calc non Af Amer: >90 mL/min (ref >90)
Glucose, Bld: 106 mg/dL — ABNORMAL HIGH (ref 70–99)
Potassium: 4.5 mEq/L (ref 3.7–5.3)
SODIUM: 140 meq/L (ref 137–147)

## 2013-09-30 ENCOUNTER — Encounter (HOSPITAL_BASED_OUTPATIENT_CLINIC_OR_DEPARTMENT_OTHER): Payer: 59 | Admitting: Anesthesiology

## 2013-09-30 ENCOUNTER — Ambulatory Visit (HOSPITAL_BASED_OUTPATIENT_CLINIC_OR_DEPARTMENT_OTHER): Payer: 59 | Admitting: Anesthesiology

## 2013-09-30 ENCOUNTER — Encounter (HOSPITAL_BASED_OUTPATIENT_CLINIC_OR_DEPARTMENT_OTHER): Payer: Self-pay | Admitting: *Deleted

## 2013-09-30 ENCOUNTER — Encounter (HOSPITAL_BASED_OUTPATIENT_CLINIC_OR_DEPARTMENT_OTHER)
Admission: RE | Disposition: A | Discharge status: Home / Self Care | Payer: Self-pay | Source: Ambulatory Visit | Attending: Orthopedic Surgery

## 2013-09-30 ENCOUNTER — Ambulatory Visit (HOSPITAL_BASED_OUTPATIENT_CLINIC_OR_DEPARTMENT_OTHER)
Admission: RE | Admit: 2013-09-30 | Discharge: 2013-09-30 | Disposition: A | Discharge status: Home / Self Care | Payer: 59 | Source: Ambulatory Visit | Attending: Orthopedic Surgery | Admitting: Orthopedic Surgery

## 2013-09-30 DIAGNOSIS — K219 Gastro-esophageal reflux disease without esophagitis: Secondary | ICD-10-CM | POA: Insufficient documentation

## 2013-09-30 DIAGNOSIS — Z860100 Personal history of colon polyps, unspecified: Secondary | ICD-10-CM | POA: Insufficient documentation

## 2013-09-30 DIAGNOSIS — E785 Hyperlipidemia, unspecified: Secondary | ICD-10-CM | POA: Insufficient documentation

## 2013-09-30 DIAGNOSIS — E119 Type 2 diabetes mellitus without complications: Secondary | ICD-10-CM | POA: Insufficient documentation

## 2013-09-30 DIAGNOSIS — Z79899 Other long term (current) drug therapy: Secondary | ICD-10-CM | POA: Insufficient documentation

## 2013-09-30 DIAGNOSIS — Y929 Unspecified place or not applicable: Secondary | ICD-10-CM | POA: Insufficient documentation

## 2013-09-30 DIAGNOSIS — S83289A Other tear of lateral meniscus, current injury, unspecified knee, initial encounter: Secondary | ICD-10-CM | POA: Diagnosis not present

## 2013-09-30 DIAGNOSIS — W19XXXA Unspecified fall, initial encounter: Secondary | ICD-10-CM | POA: Insufficient documentation

## 2013-09-30 DIAGNOSIS — I10 Essential (primary) hypertension: Secondary | ICD-10-CM | POA: Insufficient documentation

## 2013-09-30 DIAGNOSIS — Z885 Allergy status to narcotic agent status: Secondary | ICD-10-CM | POA: Insufficient documentation

## 2013-09-30 DIAGNOSIS — Z7982 Long term (current) use of aspirin: Secondary | ICD-10-CM | POA: Insufficient documentation

## 2013-09-30 DIAGNOSIS — M2240 Chondromalacia patellae, unspecified knee: Secondary | ICD-10-CM | POA: Insufficient documentation

## 2013-09-30 DIAGNOSIS — S83251D Bucket-handle tear of lateral meniscus, current injury, right knee, subsequent encounter: Secondary | ICD-10-CM

## 2013-09-30 HISTORY — PX: KNEE ARTHROSCOPY WITH MEDIAL MENISECTOMY: SHX5651

## 2013-09-30 HISTORY — DX: Gastro-esophageal reflux disease without esophagitis: K21.9

## 2013-09-30 HISTORY — DX: Unspecified osteoarthritis, unspecified site: M19.90

## 2013-09-30 LAB — POCT HEMOGLOBIN-HEMACUE: Hemoglobin: 14.2 g/dL (ref 12.0–15.0)

## 2013-09-30 LAB — GLUCOSE, CAPILLARY
GLUCOSE-CAPILLARY: 133 mg/dL — AB (ref 70–99)
GLUCOSE-CAPILLARY: 141 mg/dL — AB (ref 70–99)

## 2013-09-30 SURGERY — ARTHROSCOPY, KNEE, WITH MEDIAL MENISCECTOMY
Anesthesia: General | Site: Knee | Laterality: Right

## 2013-09-30 MED ORDER — DEXAMETHASONE SODIUM PHOSPHATE 4 MG/ML IJ SOLN
INTRAMUSCULAR | Status: DC | PRN
  Administered 2013-09-30: 10 mg via INTRAVENOUS

## 2013-09-30 MED ORDER — BUPIVACAINE HCL (PF) 0.25 % IJ SOLN
INTRAMUSCULAR | Status: DC | PRN
  Administered 2013-09-30: 20 mL

## 2013-09-30 MED ORDER — HYDROMORPHONE HCL 1 MG/ML IJ SOLN
0.2500 mg | INTRAMUSCULAR | Status: DC | PRN
  Administered 2013-09-30 (×3): 0.5 mg via INTRAVENOUS

## 2013-09-30 MED ORDER — PROMETHAZINE HCL 25 MG/ML IJ SOLN
6.2500 mg | INTRAMUSCULAR | Status: DC | PRN
  Administered 2013-09-30: 6.25 mg via INTRAVENOUS

## 2013-09-30 MED ORDER — FENTANYL CITRATE 0.05 MG/ML IJ SOLN
INTRAMUSCULAR | Status: DC | PRN
  Administered 2013-09-30 (×2): 50 ug via INTRAVENOUS
  Administered 2013-09-30: 100 ug via INTRAVENOUS

## 2013-09-30 MED ORDER — POVIDONE-IODINE 7.5 % EX SOLN: Freq: Once | CUTANEOUS | Status: DC

## 2013-09-30 MED ORDER — FENTANYL CITRATE 0.05 MG/ML IJ SOLN: 50.0000 ug | INTRAMUSCULAR | Status: DC | PRN

## 2013-09-30 MED ORDER — MIDAZOLAM HCL 2 MG/2ML IJ SOLN
INTRAMUSCULAR | Status: AC
  Filled 2013-09-30: qty 2

## 2013-09-30 MED ORDER — HYDROMORPHONE HCL 1 MG/ML IJ SOLN
INTRAMUSCULAR | Status: AC
  Filled 2013-09-30: qty 1

## 2013-09-30 MED ORDER — MIDAZOLAM HCL 5 MG/5ML IJ SOLN
INTRAMUSCULAR | Status: DC | PRN
  Administered 2013-09-30: 2 mg via INTRAVENOUS

## 2013-09-30 MED ORDER — ONDANSETRON HCL 4 MG/2ML IJ SOLN
INTRAMUSCULAR | Status: DC | PRN
  Administered 2013-09-30: 4 mg via INTRAVENOUS

## 2013-09-30 MED ORDER — PROPOFOL 10 MG/ML IV BOLUS
INTRAVENOUS | Status: DC | PRN
  Administered 2013-09-30: 200 mg via INTRAVENOUS

## 2013-09-30 MED ORDER — DOCUSATE SODIUM 100 MG PO CAPS: 100.0000 mg | ORAL_CAPSULE | Freq: Three times a day (TID) | ORAL | Status: DC | PRN

## 2013-09-30 MED ORDER — OXYCODONE HCL 5 MG/5ML PO SOLN: 5.0000 mg | Freq: Once | ORAL | Status: DC | PRN

## 2013-09-30 MED ORDER — BUPIVACAINE HCL (PF) 0.25 % IJ SOLN
INTRAMUSCULAR | Status: AC
  Filled 2013-09-30: qty 30

## 2013-09-30 MED ORDER — MIDAZOLAM HCL 2 MG/2ML IJ SOLN: 1.0000 mg | INTRAMUSCULAR | Status: DC | PRN

## 2013-09-30 MED ORDER — LIDOCAINE HCL (CARDIAC) 20 MG/ML IV SOLN
INTRAVENOUS | Status: DC | PRN
  Administered 2013-09-30: 50 mg via INTRAVENOUS

## 2013-09-30 MED ORDER — OXYCODONE HCL 5 MG PO TABS: 5.0000 mg | ORAL_TABLET | Freq: Once | ORAL | Status: DC | PRN

## 2013-09-30 MED ORDER — SCOPOLAMINE 1 MG/3DAYS TD PT72SCOPOLAMINE 1 MG/3DAYS
1.0000 | MEDICATED_PATCH | TRANSDERMAL | Status: DC
Start: 2013-09-30 — End: 2013-09-30

## 2013-09-30 MED ORDER — SODIUM CHLORIDE 0.9 % IR SOLN
Status: DC | PRN
  Administered 2013-09-30: 3000 mL

## 2013-09-30 MED ORDER — CEFAZOLIN SODIUM-DEXTROSE 2-3 GM-% IV SOLR
2.0000 g | INTRAVENOUS | Status: AC
  Administered 2013-09-30: 2 g via INTRAVENOUS

## 2013-09-30 MED ORDER — CEFAZOLIN SODIUM-DEXTROSE 2-3 GM-% IV SOLR
INTRAVENOUS | Status: AC
  Filled 2013-09-30: qty 50

## 2013-09-30 MED ORDER — LACTATED RINGERS IV SOLN
INTRAVENOUS | Status: DC
  Administered 2013-09-30 (×2): via INTRAVENOUS

## 2013-09-30 MED ORDER — FENTANYL CITRATE 0.05 MG/ML IJ SOLN
INTRAMUSCULAR | Status: AC
  Filled 2013-09-30: qty 6

## 2013-09-30 MED ORDER — HYDROCODONE-ACETAMINOPHEN 5-325 MG PO TABS: 1.0000 | ORAL_TABLET | Freq: Four times a day (QID) | ORAL | Status: DC | PRN

## 2013-09-30 MED ORDER — PROMETHAZINE HCL 25 MG/ML IJ SOLN
INTRAMUSCULAR | Status: AC
  Filled 2013-09-30: qty 1

## 2013-09-30 SURGICAL SUPPLY — 32 items
BANDAGE ELASTIC 6 VELCRO ST LF (GAUZE/BANDAGES/DRESSINGS) ×2 IMPLANT
BLADE 4.2CUDA (BLADE) ×2 IMPLANT
BLADE CUTTER GATOR 3.5 (BLADE) IMPLANT
BLADE CUTTER MENIS 5.5 (BLADE) IMPLANT
CANISTER SUCT 3000ML (MISCELLANEOUS) IMPLANT
CHLORAPREP W/TINT 26ML (MISCELLANEOUS) ×2 IMPLANT
DRAPE ARTHROSCOPY W/POUCH 114 (DRAPES) ×2 IMPLANT
DRAPE U-SHAPE 47X51 STRL (DRAPES) ×2 IMPLANT
DRSG EMULSION OIL 3X3 NADH (GAUZE/BANDAGES/DRESSINGS) ×2 IMPLANT
GAUZE SPONGE 4X4 12PLY STRL (GAUZE/BANDAGES/DRESSINGS) ×2 IMPLANT
GLOVE BIO SURGEON STRL SZ7 (GLOVE) ×2 IMPLANT
GLOVE BIO SURGEON STRL SZ7.5 (GLOVE) ×2 IMPLANT
GLOVE BIOGEL PI IND STRL 7.0 (GLOVE) ×1 IMPLANT
GLOVE BIOGEL PI IND STRL 8 (GLOVE) ×1 IMPLANT
GLOVE BIOGEL PI INDICATOR 7.0 (GLOVE) ×1
GLOVE BIOGEL PI INDICATOR 8 (GLOVE) ×1
GLOVE SURG SS PI 7.0 STRL IVOR (GLOVE) ×1 IMPLANT
GOWN STRL REUS W/ TWL LRG LVL3 (GOWN DISPOSABLE) ×2 IMPLANT
GOWN STRL REUS W/TWL LRG LVL3 (GOWN DISPOSABLE) ×4
GOWN STRL REUS W/TWL XL LVL4 (GOWN DISPOSABLE) ×2 IMPLANT
IV NS IRRIG 3000ML ARTHROMATIC (IV SOLUTION) ×3 IMPLANT
KNEE WRAP E Z 3 GEL PACK (MISCELLANEOUS) ×2 IMPLANT
MANIFOLD NEPTUNE II (INSTRUMENTS) ×2 IMPLANT
PACK ARTHROSCOPY DSU (CUSTOM PROCEDURE TRAY) ×2 IMPLANT
PACK BASIN DAY SURGERY FS (CUSTOM PROCEDURE TRAY) ×2 IMPLANT
SUT ETHILON 4 0 PS 2 18 (SUTURE) ×2 IMPLANT
SUT TIGER TAPE 7 IN WHITE (SUTURE) IMPLANT
TOWEL OR 17X24 6PK STRL BLUE (TOWEL DISPOSABLE) ×2 IMPLANT
TOWEL OR NON WOVEN STRL DISP B (DISPOSABLE) ×2 IMPLANT
TUBING ARTHROSCOPY IRRIG 16FT (MISCELLANEOUS) ×2 IMPLANT
WAND STAR VAC 90 (SURGICAL WAND) IMPLANT
WATER STERILE IRR 1000ML POUR (IV SOLUTION) ×2 IMPLANT

## 2013-09-30 NOTE — Op Note (Signed)
Procedure(s): Right knee arthroscopy partial meniscectomy, debridement Procedure Note  Emma Duran female 59 y.o. 09/30/2013  Procedure(s) and Anesthesia Type: #1 arthroscopic right knee partial lateral meniscectomy #2 arthroscopic right knee abrasion arthroplasty patella and cochlea chondromalacia     Surgeon(s) and Role:    * Nita Sells, MD - Primary     Surgeon: Nita Sells   Assistants: Jeanmarie Hubert PA-C [facility] was present and scrubbed throughout the procedure and was essential in positioning, assisting with the camera and instrumentation,, and closure)  Anesthesia: General endotracheal anesthesia   Procedure Detail  Right knee arthroscopy partial meniscectomy, debridement  Estimated Blood Loss: Min         Drains: none  Blood Given: none         Specimens: none        Complications:  * No complications entered in OR log *         Disposition: PACU - hemodynamically stable.         Condition: stable    Procedure:   INDICATIONS FOR SURGERY: The patient is 58 y.o. female who has had over one month of right knee pain after twisting injury after a fall. She was found on MRI to have a bucket handle displaced lateral meniscus tear. She failed conservative management was unable to achieve full extension or ambulate without pain. She was indicated for surgical treatment.  OPERATIVE FINDINGS: Examination under anesthesia: She was noted to have about 20 lack To full extension with a firm endpoint. No varus or valgus instability. 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 There was grade 2 chondral malacia anterior and the lateral tibial plateau region of the bucket-handle tear. Some grade 1 changes on the medial and lateral femoral condyles. Medial meniscus: Intact Lateral meniscus: Displaced bucket-handle lateral meniscus tear.  Resected. Anterior cruciate ligament/PCL: Loose bodies: None  DESCRIPTION OF PROCEDURE: The patient was identified in preoperative  holding area where I personally marked the operative site after  verifying site, side, and procedure with the patient.  The patient was taken back to the operating room where general anesthesia was induced without complication and was placed in the supine position with the operative leg placed in a padded leg holder. The foot of the bed was dropped. The opposite extremity was well padded.  The right lower extremity was then prepped and  draped in a standard sterile fashion. The appropriate time-out  procedure was carried out. The patient did receive IV antibiotics  within 30 minutes of incision.   A small stab incision was made in the anterolateral portal position. The arthroscope was introduced in the joint. A medial portal was then established under direct visualization just above the anterior horn of the medial meniscus. Diagnostic arthroscopy was then carried out with findings as described above.  Medial compartment was examined with the leg in a valgus mass. She was noted to have an intact meniscus anterior to posterior. There is a mild grade 1 chondromalacia of the femoral condyle tibial plateau no displaceable chondral fragments. The anterior cruciate ligament was examined and found to be intact. Immediately noted was the displaced bucket-handle meniscus tear in the lateral compartment which initially precluded the use. She was placed in the figure 4 position and the lateral compartment was examined. The probe was used to reduce the bucket-handle meniscus tear. It was torn from the meniscal root all around anteriorly.  it  was detached anteriorly sharply with a biter and then posteriorly at the root. A grasper was then used to remove the torn fragment. The fragment measured about 3 cm x 8 mm. The edges of the tear posteriorly were then resected with the biter and  shaver getting back to healthy peripheral rim less than one third of the initial meniscus. No further displaceable tears were noted. Attention was then turned to the patellofemoral joint where she was noted to have grade 4 chondromalacia of the patella and lateral trochlea. The shaver was used to try and debridement down to bleeding bone to promote fibrocartilage formation. A second look was taken throughout the joint and no loose bodies were noted. The knee was then drained and the arthroscopic was removed.  The arthroscopic equipment was removed from the joint and the portals were closed with 3-0 nylon in an interrupted fashion. The knee was infiltrated with 20 cc quarter percent Marcaine without epinephrine.  Sterile dressings were then applied including Xeroform 4 x 4's ABDs an ACE bandage.  The patient was then allowed to awaken from general anesthesia, transferred to the stretcher and taken to the recovery room in stable condition.   POSTOPERATIVE PLAN: The patient will be discharged home today and will followup in one week for suture removal and wound check.

## 2013-09-30 NOTE — Anesthesia Procedure Notes (Signed)
Procedure Name: LMA Insertion Date/Time: 09/30/2013 1:23 PM Performed by: Melynda Ripple D Pre-anesthesia Checklist: Patient identified, Emergency Drugs available, Suction available and Patient being monitored Patient Re-evaluated:Patient Re-evaluated prior to inductionOxygen Delivery Method: Circle System Utilized Preoxygenation: Pre-oxygenation with 100% oxygen Intubation Type: IV induction Ventilation: Mask ventilation without difficulty LMA: LMA inserted LMA Size: 4.0 Number of attempts: 1 Airway Equipment and Method: bite block Placement Confirmation: positive ETCO2 Tube secured with: Tape Dental Injury: Teeth and Oropharynx as per pre-operative assessment

## 2013-09-30 NOTE — Discharge Instructions (Signed)
Discharge Instructions after Knee Arthroscopy   You will have a light dressing on your knee.  Leave the dressing in place until the third day after your surgery and then remove it and place a band-aid over the stitches.  After the bandage has been removed you may shower, but do not soak the incision. You may begin gentle motion of your leg immediately after surgery. Pump your foot up and down 20 times per hour, every hour you are awake.  Apply ice to the knee 3 times per day for 30 minutes for the first 1 week until your knee is feeling comfortable again. Do not use heat.  You may begin straight leg raising exercises (if you have a brace with it on). While lying down, pull your foot all the way up, tighten your quadriceps muscle and lift your heel off of the ground. Hold this position for 2 seconds, and then let the leg back down. Repeat the exercise 10 times, at least 3 times a day.  Pain medicine has been prescribed for you.  Use your medicine as needed over the first 48 hours, and then you can begin to taper your use. You may take Extra Strength Tylenol or Tylenol only in place of the pain pills.  Take one 81mg  aspirin daily for 3 weeks post-operatively unless it upsets your stomach.   Please call [phone] during normal business hours or [phone] after hours for any problems. Including the following:  - excessive redness of the incisions - drainage for more than 4 days - fever of more than 101.5 F  *Please note that pain medications will not be refilled after hours or on weekends.  Post Anesthesia Home Care Instructions  Activity: Get plenty of rest for the remainder of the day. A responsible adult should stay with you for 24 hours following the procedure.  For the next 24 hours, DO NOT: -Drive a car -Paediatric nurse -Drink alcoholic beverages -Take any medication unless instructed by your physician -Make any legal decisions or sign important papers.  Meals: Start with  liquid foods such as gelatin or soup. Progress to regular foods as tolerated. Avoid greasy, spicy, heavy foods. If nausea and/or vomiting occur, drink only clear liquids until the nausea and/or vomiting subsides. Call your physician if vomiting continues.  Special Instructions/Symptoms: Your throat may feel dry or sore from the anesthesia or the breathing tube placed in your throat during surgery. If this causes discomfort, gargle with warm salt water. The discomfort should disappear within 24 hours.

## 2013-09-30 NOTE — Anesthesia Postprocedure Evaluation (Signed)
  Anesthesia Post-op Note  Patient: Emma Duran  Procedure(s) Performed: Procedure(s) with comments: Right knee arthroscopy partial meniscectomy, debridement (Right) - Right knee arthroscopy partial menisectomy, debridement  Patient Location: PACU  Anesthesia Type:General  Level of Consciousness: awake and alert   Airway and Oxygen Therapy: Patient Spontanous Breathing  Post-op Pain: moderate  Post-op Assessment: Post-op Vital signs reviewed, Patient's Cardiovascular Status Stable and Respiratory Function Stable  Post-op Vital Signs: Reviewed  Filed Vitals:   09/30/13 1515  BP: 149/70  Pulse: 71  Temp:   Resp: 12    Complications: No apparent anesthesia complications

## 2013-09-30 NOTE — Anesthesia Preprocedure Evaluation (Signed)
Anesthesia Evaluation    Reviewed: Allergy & Precautions, H&P , NPO status , Patient's Chart, lab work & pertinent test results  History of Anesthesia Complications Negative for: history of anesthetic complications  Airway       Dental   Pulmonary neg pulmonary ROS,          Cardiovascular hypertension, Pt. on medications     Neuro/Psych negative neurological ROS  negative psych ROS   GI/Hepatic GERD-  Medicated,  Endo/Other  diabetes  Renal/GU      Musculoskeletal   Abdominal   Peds  Hematology   Anesthesia Other Findings   Reproductive/Obstetrics                           Anesthesia Physical Anesthesia Plan  ASA: II  Anesthesia Plan: General   Post-op Pain Management:    Induction: Intravenous  Airway Management Planned: LMA  Additional Equipment:   Intra-op Plan:   Post-operative Plan: Extubation in OR  Informed Consent:   Plan Discussed with: CRNA, Anesthesiologist and Surgeon  Anesthesia Plan Comments:         Anesthesia Quick Evaluation

## 2013-09-30 NOTE — Transfer of Care (Signed)
Immediate Anesthesia Transfer of Care Note  Patient: Emma Duran  Procedure(s) Performed: Procedure(s) with comments: Right knee arthroscopy partial meniscectomy, debridement (Right) - Right knee arthroscopy partial menisectomy, debridement  Patient Location: PACU  Anesthesia Type:General  Level of Consciousness: awake, alert  and oriented  Airway & Oxygen Therapy: Patient Spontanous Breathing and Patient connected to face mask oxygen  Post-op Assessment: Report given to PACU RN and Post -op Vital signs reviewed and stable  Post vital signs: Reviewed and stable  Complications: No apparent anesthesia complications

## 2013-09-30 NOTE — H&P (Signed)
Emma Duran is an 58 y.o. female.   Chief Complaint: R knee pain  HPI: 1 mo R knee pain and limited ambulation, failed nonop tx, MRI shows displaced bucket handle lateral meniscus tear.  Past Medical History  Diagnosis Date  . Diabetes mellitus   . Hyperlipidemia   . Hypertension   . Abnormal breast exam     chip in right breast as marker for abnormality   . History of colon polyps   . Internal hemorrhoids   . External hemorrhoids   . GERD (gastroesophageal reflux disease)   . Arthritis   . Cancer 2011    rt lumpectomy    Past Surgical History  Procedure Laterality Date  . Abdominal hysterectomy    . Kidney stone surgery  2010  . Breast lumpectomy  09/28/10    right breast     Family History  Problem Relation Age of Onset  . Diabetes Mother   . Lung cancer Father   . Cancer Father 79    lung cancer   . Lung cancer Paternal Grandmother   . Cancer Paternal Grandfather   . Hypertension    . Colon cancer Neg Hx   . Stomach cancer Neg Hx   . Esophageal cancer Neg Hx    Social History:  reports that she has never smoked. She has never used smokeless tobacco. She reports that she does not drink alcohol or use illicit drugs.  Allergies:  Allergies  Allergen Reactions  . Omeprazole     Full body hives and itching   . Morphine   . Tramadol   . Morphine And Related Anxiety    Medications Prior to Admission  Medication Sig Dispense Refill  . aspirin 81 MG tablet Take 81 mg by mouth daily.      Marland Kitchen atorvastatin (LIPITOR) 40 MG tablet Take 40 mg by mouth daily.      . Canagliflozin (INVOKANA) 300 MG TABS Take by mouth.      . estradiol (ESTRACE) 0.5 MG tablet Take 1 mg by mouth daily.       Marland Kitchen glimepiride (AMARYL) 4 MG tablet Take 4 mg by mouth daily before breakfast.       . lisinopril-hydrochlorothiazide (PRINZIDE,ZESTORETIC) 10-12.5 MG per tablet Take 1 tablet by mouth daily.        . Magnesium 250 MG TABS Take by mouth. 1 time daily      . metFORMIN (GLUMETZA) 1000  MG (MOD) 24 hr tablet Take 1,000 mg by mouth 2 (two) times daily with a meal.       . naproxen (NAPROSYN) 500 MG tablet Take 500 mg by mouth 2 (two) times daily with a meal.      . potassium citrate (UROCIT-K) 10 MEQ (1080 MG) SR tablet Take 10 mEq by mouth 6 (six) times daily.       Marland Kitchen VITAMIN D, ERGOCALCIFEROL, PO Take 2,000 Int'l Units by mouth daily.         Results for orders placed during the hospital encounter of 09/30/13 (from the past 48 hour(s))  BASIC METABOLIC PANEL     Status: Abnormal   Collection Time    09/29/13  2:00 PM      Result Value Ref Range   Sodium 140  137 - 147 mEq/L   Potassium 4.5  3.7 - 5.3 mEq/L   Chloride 98  96 - 112 mEq/L   CO2 27  19 - 32 mEq/L   Glucose, Bld 106 (*) 70 -  99 mg/dL   BUN 14  6 - 23 mg/dL   Creatinine, Ser 0.61  0.50 - 1.10 mg/dL   Calcium 9.9  8.4 - 10.5 mg/dL   GFR calc non Af Amer >90  >90 mL/min   GFR calc Af Amer >90  >90 mL/min   Comment: (NOTE)     The eGFR has been calculated using the CKD EPI equation.     This calculation has not been validated in all clinical situations.     eGFR's persistently <90 mL/min signify possible Chronic Kidney     Disease.   Anion gap 15  5 - 15  GLUCOSE, CAPILLARY     Status: Abnormal   Collection Time    09/30/13 12:18 PM      Result Value Ref Range   Glucose-Capillary 133 (*) 70 - 99 mg/dL  POCT HEMOGLOBIN-HEMACUE     Status: None   Collection Time    09/30/13 12:21 PM      Result Value Ref Range   Hemoglobin 14.2  12.0 - 15.0 g/dL   No results found.  Review of Systems  All other systems reviewed and are negative.   Blood pressure 123/71, pulse 80, temperature 98 F (36.7 C), resp. rate 16, height $RemoveBe'5\' 7"'dwfUFTmeQ$  (1.702 m), weight 85.73 kg (189 lb), SpO2 98.00%. Physical Exam  Constitutional: She is oriented to person, place, and time. She appears well-developed and well-nourished.  HENT:  Head: Atraumatic.  Eyes: EOM are normal.  Cardiovascular: Intact distal pulses.    Musculoskeletal:  R knee mod effusion, pain with limited ROM.  TTP med/lat JL.  Neurological: She is alert and oriented to person, place, and time.  Skin: Skin is warm and dry.  Psychiatric: She has a normal mood and affect.     Assessment/Plan Displaced bucket handle lateral meniscus tear Plan arth partial lateral meniscectomy Risks / benefits of surgery discussed Consent on chart  NPO for OR Preop antibiotics   Abri Vacca WILLIAM 09/30/2013, 12:48 PM

## 2013-10-01 ENCOUNTER — Encounter (HOSPITAL_BASED_OUTPATIENT_CLINIC_OR_DEPARTMENT_OTHER): Payer: Self-pay | Admitting: Orthopedic Surgery

## 2014-07-08 ENCOUNTER — Encounter: Payer: Self-pay | Admitting: Gastroenterology

## 2014-08-10 ENCOUNTER — Other Ambulatory Visit: Payer: Self-pay | Admitting: Obstetrics and Gynecology

## 2014-08-11 LAB — CYTOLOGY - PAP

## 2014-09-11 ENCOUNTER — Ambulatory Visit: Payer: 59 | Admitting: *Deleted

## 2014-09-11 VITALS — Ht 66.0 in | Wt 191.0 lb

## 2014-09-11 DIAGNOSIS — Z1211 Encounter for screening for malignant neoplasm of colon: Secondary | ICD-10-CM

## 2014-09-11 NOTE — Progress Notes (Signed)
Verified with Dr Carlean Purl that patient could use crystal lite instead of gatorade.  Patient can not tolerate gatorade, caused nausea and vomiting.

## 2014-09-11 NOTE — Progress Notes (Signed)
Patient denies any allergies to egg or soy products. Patient denies complications with anesthesia/sedation.  Patient denies oxygen use at home and denies diet medications. Emmi instructions for colonoscopy but patient refused.   

## 2014-09-14 ENCOUNTER — Telehealth: Payer: Self-pay | Admitting: Gastroenterology

## 2014-09-22 ENCOUNTER — Encounter: Payer: Self-pay | Admitting: Gastroenterology

## 2014-09-22 NOTE — Telephone Encounter (Signed)
Clarified that pt has never had breast cancer.

## 2014-09-25 ENCOUNTER — Encounter: Payer: Self-pay | Admitting: Gastroenterology

## 2014-09-25 ENCOUNTER — Ambulatory Visit (AMBULATORY_SURGERY_CENTER): Payer: 59 | Admitting: Gastroenterology

## 2014-09-25 VITALS — BP 126/75 | HR 64 | Temp 96.9°F | Resp 13 | Ht 66.0 in | Wt 191.0 lb

## 2014-09-25 DIAGNOSIS — Z1211 Encounter for screening for malignant neoplasm of colon: Secondary | ICD-10-CM

## 2014-09-25 DIAGNOSIS — K5730 Diverticulosis of large intestine without perforation or abscess without bleeding: Secondary | ICD-10-CM

## 2014-09-25 LAB — GLUCOSE, CAPILLARY
GLUCOSE-CAPILLARY: 162 mg/dL — AB (ref 65–99)
Glucose-Capillary: 147 mg/dL — ABNORMAL HIGH (ref 65–99)

## 2014-09-25 MED ORDER — SODIUM CHLORIDE 0.9 % IV SOLN: 500.0000 mL | INTRAVENOUS | Status: DC

## 2014-09-25 NOTE — Op Note (Signed)
[facility]  [address] [place], [id number]   COLONOSCOPY PROCEDURE REPORT  PATIENT: Emma Duran, Emma Duran  MR#: [id number] BIRTHDATE: 1955/05/11 , 59  yrs. old GENDER: female ENDOSCOPIST: Milus Banister, MD PROCEDURE DATE:  09/25/2014 PROCEDURE:   Colonoscopy, screening First Screening Colonoscopy - Avg.  risk and is 50 yrs.  old or older - No.  Prior Negative Screening - Now for repeat screening. N/A  History of Adenoma - Now for follow-up colonoscopy & has been > or = to 3 yrs.  Yes hx of adenoma.  Has been 3 or more years since last colonoscopy.  Recommend repeat exam, <10 yrs? No ASA CLASS:   Class II INDICATIONS:Screening for colonic neoplasia. MEDICATIONS: Monitored anesthesia care and Propofol 180 mg IV  DESCRIPTION OF PROCEDURE:   After the risks benefits and alternatives of the procedure were thoroughly explained, informed consent was obtained.  The digital rectal exam revealed no abnormalities of the rectum.   The [id number]  endoscope was introduced through the anus and advanced to the cecum, which was identified by both the appendix and ileocecal valve. No adverse events experienced.   The quality of the prep was adequate  The instrument was then slowly withdrawn as the colon was fully examined. Estimated blood loss is zero unless otherwise noted in this procedure report.  COLON FINDINGS: There was mild diverticulosis noted in the left colon.   The examination was otherwise normal.  Retroflexed views revealed no abnormalities. The time to cecum = 5.6 Withdrawal time = 9.3   The scope was withdrawn and the procedure completed. COMPLICATIONS: There were no immediate complications.  ENDOSCOPIC IMPRESSION: 1.   Mild diverticulosis was noted in the left colon 2.   The examination was otherwise normal; no polyps or cancers  RECOMMENDATIONS: You should continue to follow colorectal cancer screening guidelines for "routine risk" patients with a  repeat colonoscopy in 10 years.   eSigned:  Milus Banister, MD 09/25/2014 8:23 AM

## 2014-09-25 NOTE — Patient Instructions (Signed)
YOU HAD AN ENDOSCOPIC PROCEDURE TODAY AT THE [facility]:   Refer to the procedure report that was given to you for any specific questions about what was found during the examination.  If the procedure report does not answer your questions, please call your gastroenterologist to clarify.  If you requested that your care partner not be given the details of your procedure findings, then the procedure report has been included in a sealed envelope for you to review at your convenience later.  YOU SHOULD EXPECT: Some feelings of bloating in the abdomen. Passage of more gas than usual.  Walking can help get rid of the air that was put into your GI tract during the procedure and reduce the bloating. If you had a lower endoscopy (such as a colonoscopy or flexible sigmoidoscopy) you may notice spotting of blood in your stool or on the toilet paper. If you underwent a bowel prep for your procedure, you may not have a normal bowel movement for a few days.  Please Note:  You might notice some irritation and congestion in your nose or some drainage.  This is from the oxygen used during your procedure.  There is no need for concern and it should clear up in a day or so.  SYMPTOMS TO REPORT IMMEDIATELY:   Following lower endoscopy (colonoscopy or flexible sigmoidoscopy):  Excessive amounts of blood in the stool  Significant tenderness or worsening of abdominal pains  Swelling of the abdomen that is new, acute  Fever of 100F or higher   For urgent or emergent issues, a gastroenterologist can be reached at any hour by calling [phone].   DIET: Your first meal following the procedure should be a small meal and then it is ok to progress to your normal diet. Heavy or fried foods are harder to digest and may make you feel nauseous or bloated.  Likewise, meals heavy in dairy and vegetables can increase bloating.  Drink plenty of fluids but you should avoid alcoholic beverages for 24  hours.  ACTIVITY:  You should plan to take it easy for the rest of today and you should NOT DRIVE or use heavy machinery until tomorrow (because of the sedation medicines used during the test).    FOLLOW UP: Our staff will call the number listed on your records the next business day following your procedure to check on you and address any questions or concerns that you may have regarding the information given to you following your procedure. If we do not reach you, we will leave a message.  However, if you are feeling well and you are not experiencing any problems, there is no need to return our call.  We will assume that you have returned to your regular daily activities without incident.  If any biopsies were taken you will be contacted by phone or by letter within the next 1-3 weeks.  Please call us at [phone] if you have not heard about the biopsies in 3 weeks.    SIGNATURES/CONFIDENTIALITY: You and/or your care partner have signed paperwork which will be entered into your electronic medical record.  These signatures attest to the fact that that the information above on your After Visit Summary has been reviewed and is understood.  Full responsibility of the confidentiality of this discharge information lies with you and/or your care-partner. 

## 2014-09-25 NOTE — Progress Notes (Signed)
To recovery, report to Smith, RN, VSS 

## 2014-09-28 ENCOUNTER — Telehealth: Payer: Self-pay

## 2014-09-28 NOTE — Telephone Encounter (Signed)
  Follow up Call-  Call back number 09/25/2014 11/08/2012  Post procedure Call Back phone  # [phone]  Permission to leave phone message Yes Yes     Patient questions:  Do you have a fever, pain , or abdominal swelling? No. Pain Score  0 *  Have you tolerated food without any problems? Yes.    Have you been able to return to your normal activities? Yes.    Do you have any questions about your discharge instructions: Diet   No. Medications  No. Follow up visit  No.  Do you have questions or concerns about your Care? No.  Actions: * If pain score is 4 or above: No action needed, pain <4.

## 2015-01-04 MED FILL — POTASSIUM CITRATE ER 10 MEQ: 10 MEQ | 90 days supply | Qty: 540 | Fill #2

## 2015-01-05 MED FILL — metFORMIN HCL 1000 MG TABS: 1000 | 90 days supply | Qty: 180 | Fill #0

## 2015-01-05 MED FILL — ATORVASTATIN 40 MG TABLET: 40 | 30 days supply | Qty: 30 | Fill #0

## 2015-01-06 MED FILL — JANUVIA 100 MG TABLET: 100 | 90 days supply | Qty: 90 | Fill #0

## 2015-01-13 MED FILL — GLIMEPIRIDE 4 MG TABLET: 4 | 90 days supply | Qty: 180 | Fill #1

## 2015-01-13 MED FILL — LISINOPRIL-HCTZ 10-12.5 MG: 10-12.5 | 90 days supply | Qty: 90 | Fill #1

## 2015-01-29 MED FILL — NAPROXEN 500 MG TABLET: 500 | 30 days supply | Qty: 60 | Fill #0

## 2015-01-29 MED FILL — ESTRADIOL 0.5 MG TABLET: 0.5 | 30 days supply | Qty: 30 | Fill #1

## 2015-01-29 MED FILL — FLUCONAZOLE 100 MG TABLET: 100 | 90 days supply | Qty: 12 | Fill #0

## 2015-03-01 MED FILL — ESTRADIOL 0.5 MG TABLET: 0.5 | 30 days supply | Qty: 30 | Fill #2

## 2015-03-24 MED FILL — YUVAFEM 10 MCG VAGINAL INSE: 10 | 84 days supply | Qty: 24 | Fill #2

## 2015-03-29 MED FILL — ESTRADIOL 0.5 MG TABLET: 0.5 | 30 days supply | Qty: 30 | Fill #3

## 2015-04-12 MED FILL — NAPROXEN 500 MG TABLET: 500 | 30 days supply | Qty: 60 | Fill #1

## 2015-04-12 MED FILL — GLIMEPIRIDE 4 MG TABLET: 4 | 90 days supply | Qty: 180 | Fill #0

## 2015-04-14 DIAGNOSIS — I10 Essential (primary) hypertension: Secondary | ICD-10-CM | POA: Diagnosis not present

## 2015-04-14 DIAGNOSIS — Z7984 Long term (current) use of oral hypoglycemic drugs: Secondary | ICD-10-CM | POA: Diagnosis not present

## 2015-04-14 DIAGNOSIS — E1165 Type 2 diabetes mellitus with hyperglycemia: Secondary | ICD-10-CM | POA: Diagnosis not present

## 2015-04-14 DIAGNOSIS — E7800 Pure hypercholesterolemia, unspecified: Secondary | ICD-10-CM | POA: Diagnosis not present

## 2015-04-19 MED FILL — LISINOPRIL-HCTZ 10-12.5 MG: 10-12.5 | 90 days supply | Qty: 90 | Fill #0

## 2015-04-19 MED FILL — metFORMIN HCL 1000 MG TABS: 1000 | 90 days supply | Qty: 180 | Fill #1

## 2015-04-19 MED FILL — JANUVIA 100 MG TABLET: 100 | 90 days supply | Qty: 90 | Fill #1

## 2015-04-19 MED FILL — ATORVASTATIN 40 MG TABLET: 40 | 30 days supply | Qty: 30 | Fill #1

## 2015-04-30 MED FILL — ESTRADIOL 0.5 MG TABLET: 0.5 | 30 days supply | Qty: 30 | Fill #4

## 2015-05-17 DIAGNOSIS — E1165 Type 2 diabetes mellitus with hyperglycemia: Secondary | ICD-10-CM | POA: Diagnosis not present

## 2015-05-17 DIAGNOSIS — E7800 Pure hypercholesterolemia, unspecified: Secondary | ICD-10-CM | POA: Diagnosis not present

## 2015-05-17 DIAGNOSIS — Z7984 Long term (current) use of oral hypoglycemic drugs: Secondary | ICD-10-CM | POA: Diagnosis not present

## 2015-05-17 DIAGNOSIS — I10 Essential (primary) hypertension: Secondary | ICD-10-CM | POA: Diagnosis not present

## 2015-05-17 MED FILL — TRUE METRIX GLUCOSE TEST ST: 30 days supply | Qty: 100 | Fill #0

## 2015-05-17 MED FILL — TRUEplus LANCETS 30G MISC: 30 days supply | Qty: 100 | Fill #0

## 2015-05-18 MED FILL — TRULICITY 1.5 MG/0.5 ML PEN: 1.5 | 28 days supply | Qty: 2 | Fill #0

## 2015-06-01 DIAGNOSIS — N952 Postmenopausal atrophic vaginitis: Secondary | ICD-10-CM | POA: Diagnosis not present

## 2015-06-01 DIAGNOSIS — Z0000 Encounter for general adult medical examination without abnormal findings: Secondary | ICD-10-CM | POA: Diagnosis not present

## 2015-06-01 DIAGNOSIS — N3946 Mixed incontinence: Secondary | ICD-10-CM | POA: Diagnosis not present

## 2015-06-01 MED FILL — YUVAFEM 10 MCG VAGINAL INSE: 10 | 28 days supply | Qty: 18 | Fill #0

## 2015-06-02 MED FILL — ESTRADIOL 0.5 MG TABLET: 0.5 | 30 days supply | Qty: 30 | Fill #5

## 2015-06-02 MED FILL — ATORVASTATIN 40 MG TABLET: 40 | 30 days supply | Qty: 30 | Fill #2

## 2015-06-02 MED FILL — NAPROXEN 500 MG TABLET: 500 | 30 days supply | Qty: 60 | Fill #2

## 2015-06-21 DIAGNOSIS — K529 Noninfective gastroenteritis and colitis, unspecified: Secondary | ICD-10-CM | POA: Diagnosis not present

## 2015-06-21 DIAGNOSIS — T148 Other injury of unspecified body region: Secondary | ICD-10-CM | POA: Diagnosis not present

## 2015-06-24 MED FILL — TRULICITY 1.5 MG/0.5 ML PEN: 1.5 | 28 days supply | Qty: 2 | Fill #1

## 2015-06-29 MED FILL — ATORVASTATIN 40 MG TABLET: 40 | 30 days supply | Qty: 30 | Fill #3

## 2015-06-29 MED FILL — ESTRADIOL 0.5 MG TABLET: 0.5 | 30 days supply | Qty: 30 | Fill #6

## 2015-06-29 MED FILL — POTASSIUM CITRATE ER 10 MEQ: 10 MEQ | 90 days supply | Qty: 540 | Fill #0

## 2015-07-05 MED FILL — TRUEplus LANCETS 30G MISC: 30 days supply | Qty: 100 | Fill #1

## 2015-07-05 MED FILL — GLIMEPIRIDE 4 MG TABLET: 4 | 90 days supply | Qty: 180 | Fill #1

## 2015-07-05 MED FILL — TRUE METRIX GLUCOSE TEST ST: 30 days supply | Qty: 100 | Fill #1

## 2015-07-15 DIAGNOSIS — E7800 Pure hypercholesterolemia, unspecified: Secondary | ICD-10-CM | POA: Diagnosis not present

## 2015-07-15 DIAGNOSIS — E1165 Type 2 diabetes mellitus with hyperglycemia: Secondary | ICD-10-CM | POA: Diagnosis not present

## 2015-07-15 DIAGNOSIS — R197 Diarrhea, unspecified: Secondary | ICD-10-CM | POA: Diagnosis not present

## 2015-07-15 DIAGNOSIS — I10 Essential (primary) hypertension: Secondary | ICD-10-CM | POA: Diagnosis not present

## 2015-07-21 MED FILL — CLOBETASOL 0.05% CREAM: 0.05 | 15 days supply | Qty: 30 | Fill #0

## 2015-07-26 MED FILL — ESTRADIOL 0.5 MG TABLET: 0.5 | 30 days supply | Qty: 30 | Fill #0

## 2015-07-26 MED FILL — LISINOPRIL-HCTZ 10-12.5 MG: 10-12.5 | 90 days supply | Qty: 90 | Fill #1

## 2015-07-28 MED FILL — XULTOPHY 100 UNIT-3.6MG/ML: 100-3.6 | 90 days supply | Qty: 15 | Fill #0

## 2015-07-28 MED FILL — metFORMIN HCL 1000 MG TABS: 1000 | 90 days supply | Qty: 180 | Fill #0

## 2015-08-09 MED FILL — UNIFINE PENTIPS 32GX5/32: 32G X 4 MM | 90 days supply | Qty: 100 | Fill #0

## 2015-08-09 MED FILL — NAPROXEN 500 MG TABLET: 500 | 30 days supply | Qty: 60 | Fill #3

## 2015-08-23 MED FILL — ATORVASTATIN 40 MG TABLET: 40 | 30 days supply | Qty: 30 | Fill #0

## 2015-08-23 MED FILL — YUVAFEM 10 MCG VAGINAL INSE: 10 | 28 days supply | Qty: 18 | Fill #1

## 2015-08-30 MED FILL — ESTRADIOL 0.5 MG TABLET: 0.5 | 30 days supply | Qty: 30 | Fill #1

## 2015-08-31 DIAGNOSIS — Z23 Encounter for immunization: Secondary | ICD-10-CM | POA: Diagnosis not present

## 2015-08-31 DIAGNOSIS — E7800 Pure hypercholesterolemia, unspecified: Secondary | ICD-10-CM | POA: Diagnosis not present

## 2015-08-31 DIAGNOSIS — E1165 Type 2 diabetes mellitus with hyperglycemia: Secondary | ICD-10-CM | POA: Diagnosis not present

## 2015-08-31 DIAGNOSIS — I10 Essential (primary) hypertension: Secondary | ICD-10-CM | POA: Diagnosis not present

## 2015-09-09 DIAGNOSIS — Z6829 Body mass index (BMI) 29.0-29.9, adult: Secondary | ICD-10-CM | POA: Diagnosis not present

## 2015-09-09 DIAGNOSIS — Z01419 Encounter for gynecological examination (general) (routine) without abnormal findings: Secondary | ICD-10-CM | POA: Diagnosis not present

## 2015-09-10 MED FILL — TERCONAZOLE 0.4% VAG CREAM: 0.4 | 7 days supply | Qty: 45 | Fill #0

## 2015-09-13 DIAGNOSIS — Z1231 Encounter for screening mammogram for malignant neoplasm of breast: Secondary | ICD-10-CM | POA: Diagnosis not present

## 2015-09-28 MED FILL — ESTRADIOL 0.5 MG TABLET: 0.5 | 90 days supply | Qty: 90 | Fill #0

## 2015-09-28 MED FILL — TRUEplus LANCETS 30G MISC: 30 days supply | Qty: 100 | Fill #2

## 2015-09-28 MED FILL — TRUE METRIX GLUCOSE TEST ST: 30 days supply | Qty: 100 | Fill #2

## 2015-10-07 DIAGNOSIS — Z23 Encounter for immunization: Secondary | ICD-10-CM | POA: Diagnosis not present

## 2015-10-07 DIAGNOSIS — T63441A Toxic effect of venom of bees, accidental (unintentional), initial encounter: Secondary | ICD-10-CM | POA: Diagnosis not present

## 2015-10-11 MED FILL — NAPROXEN 500 MG TABLET: 500 | 30 days supply | Qty: 60 | Fill #0

## 2015-10-12 MED FILL — XULTOPHY 100 UNIT-3.6MG/ML: 100-3.6 | 33 days supply | Qty: 15 | Fill #0

## 2015-10-27 MED FILL — LISINOPRIL-HCTZ 10-12.5 MG: 10-12.5 | 90 days supply | Qty: 90 | Fill #2

## 2015-10-27 MED FILL — YUVAFEM 10 MCG VAGINAL INSE: 10 | 28 days supply | Qty: 18 | Fill #2

## 2015-10-27 MED FILL — ATORVASTATIN 40 MG TABLET: 40 | 30 days supply | Qty: 30 | Fill #1

## 2015-11-04 MED FILL — metFORMIN HCL 1000 MG TABS: 1000 | 90 days supply | Qty: 180 | Fill #1

## 2015-11-16 MED FILL — UNIFINE PENTIPS 32GX5/32: 32G X 4 MM | 90 days supply | Qty: 100 | Fill #1

## 2015-11-16 MED FILL — XULTOPHY 100 UNIT-3.6MG/ML: 100-3.6 | 33 days supply | Qty: 15 | Fill #1

## 2015-11-16 MED FILL — UNIFINE PENTIPS 32GX5/32": 32G X 4 MM | 90 days supply | Qty: 100 | Fill #1

## 2015-11-18 MED FILL — TRUE METRIX GLUCOSE TEST ST: 30 days supply | Qty: 100 | Fill #3

## 2015-11-29 DIAGNOSIS — E1165 Type 2 diabetes mellitus with hyperglycemia: Secondary | ICD-10-CM | POA: Diagnosis not present

## 2015-11-29 DIAGNOSIS — E7800 Pure hypercholesterolemia, unspecified: Secondary | ICD-10-CM | POA: Diagnosis not present

## 2015-11-29 DIAGNOSIS — I10 Essential (primary) hypertension: Secondary | ICD-10-CM | POA: Diagnosis not present

## 2015-12-01 DIAGNOSIS — N952 Postmenopausal atrophic vaginitis: Secondary | ICD-10-CM | POA: Diagnosis not present

## 2015-12-01 DIAGNOSIS — N3946 Mixed incontinence: Secondary | ICD-10-CM | POA: Diagnosis not present

## 2015-12-01 MED FILL — TRUEplus LANCETS 30G MISC: 30 days supply | Qty: 100 | Fill #3

## 2015-12-09 MED FILL — NAPROXEN 500 MG TABLET: 500 | 30 days supply | Qty: 60 | Fill #1

## 2015-12-20 MED FILL — POTASSIUM CITRATE ER 10 MEQ: 10 MEQ | 30 days supply | Qty: 180 | Fill #1

## 2015-12-21 MED FILL — XULTOPHY 100 UNIT-3.6MG/ML: 100-3.6 | 30 days supply | Qty: 15 | Fill #0

## 2015-12-30 MED FILL — ESTRADIOL 0.5 MG TABLET: 0.5 | 90 days supply | Qty: 90 | Fill #1

## 2015-12-30 MED FILL — YUVAFEM 10 MCG VAGINAL INSE: 10 | 28 days supply | Qty: 18 | Fill #3

## 2016-01-11 MED FILL — FREESTYLE LITE METER: 30 days supply | Qty: 1 | Fill #0

## 2016-01-12 MED FILL — FREESTYLE LANCETS: 90 days supply | Qty: 100 | Fill #0

## 2016-01-12 MED FILL — FREESTYLE LITE TEST STRIP: 90 days supply | Qty: 100 | Fill #0

## 2016-01-20 MED FILL — XULTOPHY 100 UNIT-3.6MG/ML: 100-3.6 | 30 days supply | Qty: 15 | Fill #1

## 2016-01-20 MED FILL — LISINOPRIL-HCTZ 10-12.5 MG: 10-12.5 | 90 days supply | Qty: 90 | Fill #0

## 2016-02-03 MED FILL — metFORMIN HCL 1000 MG TABS: 1000 | 90 days supply | Qty: 180 | Fill #0

## 2016-02-03 MED FILL — NAPROXEN 500 MG TABLET: 500 | 30 days supply | Qty: 60 | Fill #2

## 2016-02-03 MED FILL — POTASSIUM CITRATE ER 10 MEQ: 10 MEQ | 30 days supply | Qty: 180 | Fill #2

## 2016-02-21 MED FILL — UNIFINE PENTIPS 32GX5/32": 32G X 4 MM | 90 days supply | Qty: 100 | Fill #2

## 2016-02-21 MED FILL — UNIFINE PENTIPS 32GX5/32: 32G X 4 MM | 90 days supply | Qty: 100 | Fill #2

## 2016-02-21 MED FILL — XULTOPHY 100 UNIT-3.6MG/ML: 100-3.6 | 30 days supply | Qty: 15 | Fill #2

## 2016-03-06 DIAGNOSIS — I10 Essential (primary) hypertension: Secondary | ICD-10-CM | POA: Diagnosis not present

## 2016-03-06 DIAGNOSIS — E7800 Pure hypercholesterolemia, unspecified: Secondary | ICD-10-CM | POA: Diagnosis not present

## 2016-03-06 DIAGNOSIS — M1990 Unspecified osteoarthritis, unspecified site: Secondary | ICD-10-CM | POA: Diagnosis not present

## 2016-03-06 DIAGNOSIS — E1165 Type 2 diabetes mellitus with hyperglycemia: Secondary | ICD-10-CM | POA: Diagnosis not present

## 2016-03-06 DIAGNOSIS — Z0000 Encounter for general adult medical examination without abnormal findings: Secondary | ICD-10-CM | POA: Diagnosis not present

## 2016-03-06 MED FILL — YUVAFEM 10 MCG VAGINAL INSE: 10 | 28 days supply | Qty: 18 | Fill #4

## 2016-03-22 MED FILL — ESTRADIOL 0.5 MG TABLET: 0.5 | 90 days supply | Qty: 90 | Fill #2

## 2016-03-22 MED FILL — XULTOPHY 100 UNIT-3.6MG/ML: 100-3.6 | 30 days supply | Qty: 15 | Fill #3

## 2016-04-05 MED FILL — ATORVASTATIN 40 MG TABLET: 40 | 30 days supply | Qty: 30 | Fill #2

## 2016-04-05 MED FILL — NAPROXEN 500 MG TABLET: 500 | 30 days supply | Qty: 60 | Fill #3

## 2016-04-19 MED FILL — FREESTYLE LITE TEST STRIP: 90 days supply | Qty: 100 | Fill #1

## 2016-04-19 MED FILL — LISINOPRIL-HCTZ 10-12.5 MG: 10-12.5 | 90 days supply | Qty: 90 | Fill #1

## 2016-04-19 MED FILL — XULTOPHY 100 UNIT-3.6MG/ML: 100-3.6 | 30 days supply | Qty: 15 | Fill #0

## 2016-04-19 MED FILL — FREESTYLE LANCETS: 90 days supply | Qty: 100 | Fill #1

## 2016-04-19 MED FILL — POTASSIUM CITRATE ER 10 MEQ: 10 MEQ | 30 days supply | Qty: 180 | Fill #3

## 2016-05-10 MED FILL — metFORMIN HCL 1000 MG TABS: 1000 | 90 days supply | Qty: 180 | Fill #1

## 2016-05-10 MED FILL — YUVAFEM 10 MCG VAGINAL INSE: 10 | 28 days supply | Qty: 18 | Fill #5

## 2016-05-18 MED FILL — XULTOPHY 100 UNIT-3.6MG/ML: 100-3.6 | 30 days supply | Qty: 15 | Fill #1

## 2016-06-01 MED FILL — ATORVASTATIN 40 MG TABLET: 40 | 30 days supply | Qty: 30 | Fill #3

## 2016-06-05 MED FILL — NAPROXEN 500 MG TABLET: 500 | 30 days supply | Qty: 60 | Fill #0

## 2016-06-07 DIAGNOSIS — E7800 Pure hypercholesterolemia, unspecified: Secondary | ICD-10-CM | POA: Diagnosis not present

## 2016-06-07 DIAGNOSIS — I10 Essential (primary) hypertension: Secondary | ICD-10-CM | POA: Diagnosis not present

## 2016-06-07 DIAGNOSIS — Z7984 Long term (current) use of oral hypoglycemic drugs: Secondary | ICD-10-CM | POA: Diagnosis not present

## 2016-06-07 DIAGNOSIS — E1165 Type 2 diabetes mellitus with hyperglycemia: Secondary | ICD-10-CM | POA: Diagnosis not present

## 2016-06-13 MED FILL — UNIFINE PENTIPS 32GX5/32": 32G X 4 MM | 90 days supply | Qty: 100 | Fill #3

## 2016-06-13 MED FILL — POTASSIUM CITRATE ER 10 MEQ: 10 MEQ | 30 days supply | Qty: 180 | Fill #4

## 2016-06-13 MED FILL — UNIFINE PENTIPS 32GX5/32: 32G X 4 MM | 90 days supply | Qty: 100 | Fill #3

## 2016-06-21 MED FILL — XULTOPHY 100 UNIT-3.6MG/ML: 100-3.6 | 30 days supply | Qty: 15 | Fill #2

## 2016-06-21 MED FILL — ESTRADIOL 0.5 MG TABLET: 0.5 | 90 days supply | Qty: 90 | Fill #3

## 2016-07-18 MED FILL — YUVAFEM 10 MCG VAGINAL INSE: 10 | 28 days supply | Qty: 8 | Fill #0

## 2016-07-19 MED FILL — LISINOPRIL-HCTZ 10-12.5 MG: 10-12.5 | 90 days supply | Qty: 90 | Fill #0

## 2016-07-19 MED FILL — XULTOPHY 100 UNIT-3.6MG/ML: 100-3.6 | 30 days supply | Qty: 15 | Fill #0

## 2016-07-26 MED FILL — FREESTYLE LANCETS: 90 days supply | Qty: 100 | Fill #2

## 2016-07-26 MED FILL — FREESTYLE LITE TEST STRIP: 90 days supply | Qty: 100 | Fill #2

## 2016-08-01 MED FILL — NAPROXEN 500 MG TABLET: 500 | 30 days supply | Qty: 60 | Fill #1

## 2016-08-14 MED FILL — metFORMIN HCL 1000 MG TABS: 1000 | 90 days supply | Qty: 180 | Fill #0

## 2016-08-14 MED FILL — YUVAFEM 10 MCG VAGINAL INSE: 10 | 84 days supply | Qty: 24 | Fill #1

## 2016-08-17 MED FILL — POTASSIUM CITRATE ER 10 MEQ: 10 MEQ | 90 days supply | Qty: 540 | Fill #0

## 2016-08-17 MED FILL — XULTOPHY 100 UNIT-3.6MG/ML: 100-3.6 | 30 days supply | Qty: 15 | Fill #1

## 2016-09-11 DIAGNOSIS — Z6830 Body mass index (BMI) 30.0-30.9, adult: Secondary | ICD-10-CM | POA: Diagnosis not present

## 2016-09-11 DIAGNOSIS — Z7984 Long term (current) use of oral hypoglycemic drugs: Secondary | ICD-10-CM | POA: Diagnosis not present

## 2016-09-11 DIAGNOSIS — Z01419 Encounter for gynecological examination (general) (routine) without abnormal findings: Secondary | ICD-10-CM | POA: Diagnosis not present

## 2016-09-11 DIAGNOSIS — E7800 Pure hypercholesterolemia, unspecified: Secondary | ICD-10-CM | POA: Diagnosis not present

## 2016-09-11 DIAGNOSIS — Z23 Encounter for immunization: Secondary | ICD-10-CM | POA: Diagnosis not present

## 2016-09-11 DIAGNOSIS — E1165 Type 2 diabetes mellitus with hyperglycemia: Secondary | ICD-10-CM | POA: Diagnosis not present

## 2016-09-11 DIAGNOSIS — I10 Essential (primary) hypertension: Secondary | ICD-10-CM | POA: Diagnosis not present

## 2016-09-11 MED FILL — UNIFINE PENTIPS 32GX5/32: 32G X 4 MM | 90 days supply | Qty: 100 | Fill #0

## 2016-09-11 MED FILL — UNIFINE PENTIPS 32GX5/32": 32G X 4 MM | 90 days supply | Qty: 100 | Fill #0

## 2016-09-11 MED FILL — CLOBETASOL EMOLLIENT 0.05%: 0.05 | 20 days supply | Qty: 30 | Fill #0

## 2016-09-13 MED FILL — ESTRADIOL 0.5 MG TABLET: 0.5 | 90 days supply | Qty: 90 | Fill #0

## 2016-09-14 DIAGNOSIS — Z1231 Encounter for screening mammogram for malignant neoplasm of breast: Secondary | ICD-10-CM | POA: Diagnosis not present

## 2016-09-14 MED FILL — XULTOPHY 100 UNIT-3.6MG/ML: 100-3.6 | 30 days supply | Qty: 15 | Fill #2

## 2016-09-20 DIAGNOSIS — H52223 Regular astigmatism, bilateral: Secondary | ICD-10-CM | POA: Diagnosis not present

## 2016-09-20 DIAGNOSIS — H524 Presbyopia: Secondary | ICD-10-CM | POA: Diagnosis not present

## 2016-09-20 DIAGNOSIS — H40053 Ocular hypertension, bilateral: Secondary | ICD-10-CM | POA: Diagnosis not present

## 2016-09-20 DIAGNOSIS — E119 Type 2 diabetes mellitus without complications: Secondary | ICD-10-CM | POA: Diagnosis not present

## 2016-09-20 DIAGNOSIS — H5213 Myopia, bilateral: Secondary | ICD-10-CM | POA: Diagnosis not present

## 2016-09-21 DIAGNOSIS — R922 Inconclusive mammogram: Secondary | ICD-10-CM | POA: Diagnosis not present

## 2016-09-26 ENCOUNTER — Other Ambulatory Visit: Payer: Self-pay | Admitting: Radiology

## 2016-09-26 DIAGNOSIS — N6320 Unspecified lump in the left breast, unspecified quadrant: Secondary | ICD-10-CM | POA: Diagnosis not present

## 2016-09-26 DIAGNOSIS — R921 Mammographic calcification found on diagnostic imaging of breast: Secondary | ICD-10-CM | POA: Diagnosis not present

## 2016-09-26 DIAGNOSIS — D242 Benign neoplasm of left breast: Secondary | ICD-10-CM | POA: Diagnosis not present

## 2016-09-26 MED FILL — NAPROXEN 500 MG TABLET: 500 | 30 days supply | Qty: 60 | Fill #2

## 2016-10-17 MED FILL — XULTOPHY 100 UNIT-3.6MG/ML: 100-3.6 | 30 days supply | Qty: 15 | Fill #0

## 2016-10-17 MED FILL — LISINOPRIL-HCTZ 10-12.5 MG: 10-12.5 | 90 days supply | Qty: 90 | Fill #1

## 2016-11-02 MED FILL — FREESTYLE LITE TEST STRIP: 90 days supply | Qty: 100 | Fill #3

## 2016-11-02 MED FILL — YUVAFEM 10 MCG TABS: 10 | 55 days supply | Qty: 16 | Fill #2

## 2016-11-16 MED FILL — ATORVASTATIN 40 MG TABLET: 40 | 30 days supply | Qty: 30 | Fill #0

## 2016-11-16 MED FILL — metFORMIN HCL 1000 MG TABS: 1000 | 90 days supply | Qty: 180 | Fill #1

## 2016-11-16 MED FILL — XULTOPHY 100 UNIT-3.6MG/ML: 100-3.6 | 30 days supply | Qty: 15 | Fill #1

## 2016-11-17 MED FILL — FREESTYLE LANCETS: 90 days supply | Qty: 100 | Fill #3

## 2016-12-04 MED FILL — NAPROXEN 500 MG TABLET: 500 | 30 days supply | Qty: 60 | Fill #3

## 2016-12-05 DIAGNOSIS — N952 Postmenopausal atrophic vaginitis: Secondary | ICD-10-CM | POA: Diagnosis not present

## 2016-12-05 DIAGNOSIS — Z87442 Personal history of urinary calculi: Secondary | ICD-10-CM | POA: Diagnosis not present

## 2016-12-14 MED FILL — XULTOPHY 100 UNIT-3.6MG/ML: 100-3.6 | 30 days supply | Qty: 15 | Fill #2

## 2016-12-19 MED FILL — YUVAFEM 10 MCG VAGINAL INSE: 10 | 84 days supply | Qty: 24 | Fill #0

## 2016-12-20 MED FILL — ESTRADIOL 0.5 MG TABLET: 0.5 | 90 days supply | Qty: 90 | Fill #1

## 2016-12-20 MED FILL — ATORVASTATIN 40 MG TABLET: 40 | 30 days supply | Qty: 30 | Fill #1

## 2016-12-27 MED FILL — UNIFINE PENTIPS 32GX5/32: 32G X 4 MM | 90 days supply | Qty: 100 | Fill #1

## 2016-12-27 MED FILL — UNIFINE PENTIPS 32GX5/32": 32G X 4 MM | 90 days supply | Qty: 100 | Fill #1

## 2017-01-17 MED FILL — LISINOPRIL-HCTZ 10-12.5 MG: 10-12.5 | 90 days supply | Qty: 90 | Fill #2

## 2017-01-29 MED FILL — BYDUREON BCise 2 MG/0.85ML: 2 | 28 days supply | Qty: 3 | Fill #0

## 2017-01-31 MED FILL — NAPROXEN 500 MG TABLET: 500 | 30 days supply | Qty: 60 | Fill #0

## 2017-02-23 MED FILL — metFORMIN HCL 1000 MG TABS: 1000 | 90 days supply | Qty: 180 | Fill #0

## 2017-02-23 MED FILL — BYDUREON BCise 2 MG/0.85ML: 2 | 28 days supply | Qty: 3 | Fill #0

## 2017-02-23 MED FILL — FREESTYLE LANCETS: 90 days supply | Qty: 200 | Fill #0

## 2017-02-23 MED FILL — ATORVASTATIN 40 MG TABLET: 40 | 30 days supply | Qty: 30 | Fill #2

## 2017-03-08 DIAGNOSIS — N760 Acute vaginitis: Secondary | ICD-10-CM | POA: Diagnosis not present

## 2017-03-08 MED FILL — NYSTATIN 100,000 UNIT/GM CR: 100000 | 10 days supply | Qty: 30 | Fill #0

## 2017-03-08 MED FILL — TRIAMCINOLONE 0.1% CREAM: 0.1 | 7 days supply | Qty: 15 | Fill #0

## 2017-03-08 MED FILL — FLUCONAZOLE 150 MG TABLET: 150 | 1 days supply | Qty: 1 | Fill #0

## 2017-03-21 MED FILL — ESTRADIOL 0.5 MG TABLET: 0.5 | 90 days supply | Qty: 90 | Fill #2

## 2017-03-21 MED FILL — NAPROXEN 500 MG TABLET: 500 | 30 days supply | Qty: 60 | Fill #1

## 2017-03-23 DIAGNOSIS — Z0000 Encounter for general adult medical examination without abnormal findings: Secondary | ICD-10-CM | POA: Diagnosis not present

## 2017-03-23 DIAGNOSIS — E1165 Type 2 diabetes mellitus with hyperglycemia: Secondary | ICD-10-CM | POA: Diagnosis not present

## 2017-03-26 MED FILL — BYDUREON BCise 2 MG/0.85ML: 2 | 28 days supply | Qty: 3 | Fill #1

## 2017-03-26 MED FILL — TRESIBA FLEXTOUCH 100 UNITS: 100 | 90 days supply | Qty: 15 | Fill #0

## 2017-03-27 DIAGNOSIS — R922 Inconclusive mammogram: Secondary | ICD-10-CM | POA: Diagnosis not present

## 2017-04-18 MED FILL — LISINOPRIL-HCTZ 10-12.5 MG: 10-12.5 | 90 days supply | Qty: 90 | Fill #3

## 2017-04-25 MED FILL — BYDUREON BCise 2 MG/0.85ML: 2 | 28 days supply | Qty: 3 | Fill #2

## 2017-05-23 MED FILL — BYDUREON BCise 2 MG/0.85ML: 2 | 28 days supply | Qty: 3 | Fill #0

## 2017-05-29 ENCOUNTER — Other Ambulatory Visit: Payer: Self-pay

## 2017-05-29 ENCOUNTER — Emergency Department (HOSPITAL_COMMUNITY): Payer: BLUE CROSS/BLUE SHIELD

## 2017-05-29 ENCOUNTER — Inpatient Hospital Stay (HOSPITAL_COMMUNITY): Payer: BLUE CROSS/BLUE SHIELD

## 2017-05-29 ENCOUNTER — Encounter (HOSPITAL_COMMUNITY): Payer: Self-pay | Admitting: *Deleted

## 2017-05-29 ENCOUNTER — Inpatient Hospital Stay (HOSPITAL_COMMUNITY)
Admission: EM | Admit: 2017-05-29 | Discharge: 2017-05-30 | DRG: 066 | Disposition: A | Discharge status: Home / Self Care | Payer: BLUE CROSS/BLUE SHIELD | Attending: Internal Medicine | Admitting: Internal Medicine

## 2017-05-29 DIAGNOSIS — H539 Unspecified visual disturbance: Secondary | ICD-10-CM

## 2017-05-29 DIAGNOSIS — Z7984 Long term (current) use of oral hypoglycemic drugs: Secondary | ICD-10-CM | POA: Diagnosis not present

## 2017-05-29 DIAGNOSIS — I10 Essential (primary) hypertension: Secondary | ICD-10-CM | POA: Diagnosis not present

## 2017-05-29 DIAGNOSIS — E1165 Type 2 diabetes mellitus with hyperglycemia: Secondary | ICD-10-CM | POA: Diagnosis present

## 2017-05-29 DIAGNOSIS — I5030 Unspecified diastolic (congestive) heart failure: Secondary | ICD-10-CM | POA: Diagnosis not present

## 2017-05-29 DIAGNOSIS — R202 Paresthesia of skin: Secondary | ICD-10-CM | POA: Diagnosis not present

## 2017-05-29 DIAGNOSIS — K219 Gastro-esophageal reflux disease without esophagitis: Secondary | ICD-10-CM | POA: Diagnosis not present

## 2017-05-29 DIAGNOSIS — I639 Cerebral infarction, unspecified: Secondary | ICD-10-CM | POA: Diagnosis not present

## 2017-05-29 DIAGNOSIS — E785 Hyperlipidemia, unspecified: Secondary | ICD-10-CM | POA: Diagnosis present

## 2017-05-29 DIAGNOSIS — Z6829 Body mass index (BMI) 29.0-29.9, adult: Secondary | ICD-10-CM

## 2017-05-29 DIAGNOSIS — R200 Anesthesia of skin: Secondary | ICD-10-CM | POA: Diagnosis not present

## 2017-05-29 DIAGNOSIS — Z794 Long term (current) use of insulin: Secondary | ICD-10-CM | POA: Diagnosis not present

## 2017-05-29 DIAGNOSIS — K222 Esophageal obstruction: Secondary | ICD-10-CM | POA: Diagnosis not present

## 2017-05-29 DIAGNOSIS — E119 Type 2 diabetes mellitus without complications: Secondary | ICD-10-CM

## 2017-05-29 DIAGNOSIS — R29818 Other symptoms and signs involving the nervous system: Secondary | ICD-10-CM | POA: Diagnosis not present

## 2017-05-29 DIAGNOSIS — R29701 NIHSS score 1: Secondary | ICD-10-CM | POA: Diagnosis not present

## 2017-05-29 DIAGNOSIS — I63233 Cerebral infarction due to unspecified occlusion or stenosis of bilateral carotid arteries: Secondary | ICD-10-CM | POA: Diagnosis not present

## 2017-05-29 DIAGNOSIS — Z7982 Long term (current) use of aspirin: Secondary | ICD-10-CM | POA: Diagnosis not present

## 2017-05-29 DIAGNOSIS — R531 Weakness: Secondary | ICD-10-CM | POA: Diagnosis not present

## 2017-05-29 DIAGNOSIS — E663 Overweight: Secondary | ICD-10-CM | POA: Diagnosis not present

## 2017-05-29 DIAGNOSIS — Z90710 Acquired absence of both cervix and uterus: Secondary | ICD-10-CM | POA: Diagnosis not present

## 2017-05-29 DIAGNOSIS — E118 Type 2 diabetes mellitus with unspecified complications: Secondary | ICD-10-CM | POA: Diagnosis not present

## 2017-05-29 DIAGNOSIS — I6300 Cerebral infarction due to thrombosis of unspecified precerebral artery: Secondary | ICD-10-CM | POA: Diagnosis not present

## 2017-05-29 LAB — GLUCOSE, CAPILLARY
GLUCOSE-CAPILLARY: 144 mg/dL — AB (ref 65–99)
Glucose-Capillary: 117 mg/dL — ABNORMAL HIGH (ref 65–99)

## 2017-05-29 LAB — URINALYSIS, ROUTINE W REFLEX MICROSCOPIC
BILIRUBIN URINE: NEGATIVE
Glucose, UA: >=500 mg/dL — AB
Hgb urine dipstick: NEGATIVE
KETONES UR: NEGATIVE mg/dL
LEUKOCYTES UA: NEGATIVE
NITRITE: NEGATIVE
PH: 5 (ref 5.0–8.0)
PROTEIN: NEGATIVE mg/dL
Specific Gravity, Urine: 1.01 (ref 1.005–1.030)

## 2017-05-29 LAB — RAPID URINE DRUG SCREEN, HOSP PERFORMED
Amphetamines: NOT DETECTED
Barbiturates: NOT DETECTED
Benzodiazepines: NOT DETECTED
Cocaine: NOT DETECTED
OPIATES: NOT DETECTED
TETRAHYDROCANNABINOL: NOT DETECTED

## 2017-05-29 LAB — COMPREHENSIVE METABOLIC PANEL
ALBUMIN: 3.9 g/dL (ref 3.5–5.0)
ALT: 29 U/L (ref 14–54)
ANION GAP: 11 (ref 5–15)
AST: 21 U/L (ref 15–41)
Alkaline Phosphatase: 47 U/L (ref 38–126)
BILIRUBIN TOTAL: 0.9 mg/dL (ref 0.3–1.2)
BUN: 10 mg/dL (ref 6–20)
CALCIUM: 9.3 mg/dL (ref 8.9–10.3)
CO2: 24 mmol/L (ref 22–32)
Chloride: 102 mmol/L (ref 101–111)
Creatinine, Ser: 0.76 mg/dL (ref 0.44–1.00)
GFR calc non Af Amer: >60 mL/min (ref >60)
GLUCOSE: 205 mg/dL — AB (ref 65–99)
POTASSIUM: 3.9 mmol/L (ref 3.5–5.1)
SODIUM: 137 mmol/L (ref 135–145)
TOTAL PROTEIN: 7.3 g/dL (ref 6.5–8.1)

## 2017-05-29 LAB — PROTIME-INR
INR: 0.93
PROTHROMBIN TIME: 12.4 s (ref 11.4–15.2)

## 2017-05-29 LAB — DIFFERENTIAL
Abs Immature Granulocytes: 0.1 10*3/uL (ref 0.0–0.1)
Basophils Absolute: 0.1 10*3/uL (ref 0.0–0.1)
Basophils Relative: 1 %
EOS PCT: 4 %
Eosinophils Absolute: 0.4 10*3/uL (ref 0.0–0.7)
IMMATURE GRANULOCYTES: 1 %
LYMPHS ABS: 3.4 10*3/uL (ref 0.7–4.0)
Lymphocytes Relative: 39 %
MONO ABS: 0.5 10*3/uL (ref 0.1–1.0)
Monocytes Relative: 6 %
Neutro Abs: 4.3 10*3/uL (ref 1.7–7.7)
Neutrophils Relative %: 49 %

## 2017-05-29 LAB — I-STAT TROPONIN, ED: Troponin i, poc: 0 ng/mL (ref 0.00–0.08)

## 2017-05-29 LAB — ETHANOL

## 2017-05-29 LAB — I-STAT CHEM 8, ED
BUN: 11 mg/dL (ref 6–20)
CHLORIDE: 101 mmol/L (ref 101–111)
Calcium, Ion: 1.14 mmol/L — ABNORMAL LOW (ref 1.15–1.40)
Creatinine, Ser: 0.7 mg/dL (ref 0.44–1.00)
Glucose, Bld: 200 mg/dL — ABNORMAL HIGH (ref 65–99)
HEMATOCRIT: 40 % (ref 36.0–46.0)
Hemoglobin: 13.6 g/dL (ref 12.0–15.0)
POTASSIUM: 4 mmol/L (ref 3.5–5.1)
SODIUM: 137 mmol/L (ref 135–145)
TCO2: 25 mmol/L (ref 22–32)

## 2017-05-29 LAB — CBC
HCT: 40.9 % (ref 36.0–46.0)
Hemoglobin: 13.3 g/dL (ref 12.0–15.0)
MCH: 29.8 pg (ref 26.0–34.0)
MCHC: 32.5 g/dL (ref 30.0–36.0)
MCV: 91.5 fL (ref 78.0–100.0)
Platelets: 303 10*3/uL (ref 150–400)
RBC: 4.47 MIL/uL (ref 3.87–5.11)
RDW: 13.2 % (ref 11.5–15.5)
WBC: 8.6 10*3/uL (ref 4.0–10.5)

## 2017-05-29 LAB — CBG MONITORING, ED: GLUCOSE-CAPILLARY: 188 mg/dL — AB (ref 65–99)

## 2017-05-29 LAB — APTT: APTT: 26 s (ref 24–36)

## 2017-05-29 MED ORDER — IOPAMIDOL (ISOVUE-370) INJECTION 76%
INTRAVENOUS | Status: AC
  Administered 2017-05-29: 18:00:00
  Filled 2017-05-29: qty 50

## 2017-05-29 MED ORDER — IOPAMIDOL (ISOVUE-370) INJECTION 76%
50.0000 mL | Freq: Once | INTRAVENOUS | Status: AC | PRN
  Administered 2017-05-29: 50 mL via INTRAVENOUS

## 2017-05-29 MED ORDER — ATORVASTATIN CALCIUM 40 MG PO TABS
40.0000 mg | ORAL_TABLET | Freq: Every day | ORAL | Status: DC
  Administered 2017-05-29: 40 mg via ORAL
  Filled 2017-05-29 (×2): qty 1

## 2017-05-29 MED ORDER — ONDANSETRON HCL 4 MG/2ML IJ SOLN
4.0000 mg | Freq: Four times a day (QID) | INTRAMUSCULAR | Status: DC | PRN
  Administered 2017-05-29: 4 mg via INTRAVENOUS
  Filled 2017-05-29: qty 2

## 2017-05-29 MED ORDER — VITAMIN D 1000 UNITS PO TABS
2000.0000 [IU] | ORAL_TABLET | Freq: Every day | ORAL | Status: DC
  Administered 2017-05-29 – 2017-05-30 (×2): 2000 [IU] via ORAL
  Filled 2017-05-29 (×2): qty 2

## 2017-05-29 MED ORDER — INSULIN ASPART 100 UNIT/ML ~~LOC~~ SOLN
0.0000 [IU] | Freq: Three times a day (TID) | SUBCUTANEOUS | Status: DC
  Administered 2017-05-30 (×3): 2 [IU] via SUBCUTANEOUS

## 2017-05-29 MED ORDER — INSULIN DETEMIR 100 UNIT/ML ~~LOC~~ SOLN
15.0000 [IU] | Freq: Every day | SUBCUTANEOUS | Status: DC
  Administered 2017-05-30: 15 [IU] via SUBCUTANEOUS
  Filled 2017-05-29: qty 0.15

## 2017-05-29 MED ORDER — ASPIRIN 325 MG PO TABS
325.0000 mg | ORAL_TABLET | Freq: Every day | ORAL | Status: DC
  Administered 2017-05-29 – 2017-05-30 (×2): 325 mg via ORAL
  Filled 2017-05-29 (×2): qty 1

## 2017-05-29 MED ORDER — CLOPIDOGREL BISULFATE 75 MG PO TABS
75.0000 mg | ORAL_TABLET | Freq: Every day | ORAL | Status: DC
  Administered 2017-05-29 – 2017-05-30 (×2): 75 mg via ORAL
  Filled 2017-05-29 (×2): qty 1

## 2017-05-29 MED ORDER — ACETAMINOPHEN 325 MG PO TABS
650.0000 mg | ORAL_TABLET | ORAL | Status: DC | PRN
  Administered 2017-05-30: 650 mg via ORAL
  Filled 2017-05-29: qty 2

## 2017-05-29 MED ORDER — STROKE: EARLY STAGES OF RECOVERY BOOK
Freq: Once | Status: AC
  Administered 2017-05-29: 18:00:00
  Filled 2017-05-29: qty 1

## 2017-05-29 MED ORDER — ESTRADIOL 1 MG PO TABS
0.5000 mg | ORAL_TABLET | Freq: Every day | ORAL | Status: DC
  Administered 2017-05-30: 0.5 mg via ORAL
  Filled 2017-05-29: qty 0.5

## 2017-05-29 MED ORDER — SENNOSIDES-DOCUSATE SODIUM 8.6-50 MG PO TABS: 1.0000 | ORAL_TABLET | Freq: Every evening | ORAL | Status: DC | PRN

## 2017-05-29 MED ORDER — ASPIRIN 300 MG RE SUPP: 300.0000 mg | Freq: Every day | RECTAL | Status: DC

## 2017-05-29 MED ORDER — ACETAMINOPHEN 160 MG/5ML PO SOLN: 650.0000 mg | ORAL | Status: DC | PRN

## 2017-05-29 MED ORDER — POTASSIUM BICARBONATE 99 MG PO CAPS: 99.0000 mg | ORAL_CAPSULE | Freq: Every day | ORAL | Status: DC

## 2017-05-29 MED ORDER — INSULIN DETEMIR 100 UNIT/ML ~~LOC~~ SOLN: 15.0000 [IU] | Freq: Every day | SUBCUTANEOUS | Status: DC

## 2017-05-29 MED ORDER — INSULIN ASPART 100 UNIT/ML ~~LOC~~ SOLN: 0.0000 [IU] | Freq: Every day | SUBCUTANEOUS | Status: DC

## 2017-05-29 MED ORDER — SODIUM CHLORIDE 0.9 % IV SOLN
INTRAVENOUS | Status: AC
  Administered 2017-05-29: 18:00:00 via INTRAVENOUS

## 2017-05-29 MED ORDER — ACETAMINOPHEN 650 MG RE SUPP: 650.0000 mg | RECTAL | Status: DC | PRN

## 2017-05-29 NOTE — ED Notes (Signed)
Pt arrives to ED and reports at 0700 she noticed lights flashing in her left eye, then while walking outside a little after 0700 she had as sudden onset of left face, arm and leg numbness. Code stroke activated.

## 2017-05-29 NOTE — Procedures (Signed)
History: 62 yo F with Flashing lights in the left  Sedation: None  Technique: This is a 21 channel routine scalp EEG performed at the bedside with bipolar and monopolar montages arranged in accordance to the international 10/20 system of electrode placement. One channel was dedicated to EKG recording.    Background: The background consists of intermixed alpha and beta activities. There is a well defined posterior dominant rhythm of 10 Hz that attenuates with eye opening. Sleep is not recorded.  Photic stimulation: Physiologic driving is present  EEG Abnormalities: None  Clinical Interpretation: This normal EEG is recorded in the waking and drowsy state. There was no seizure or seizure predisposition recorded on this study. Please note that a normal EEG does not preclude the possibility of epilepsy.   Roland Rack, MD [facility] [phone]  If 7pm- 7am, please page neurology on call as listed in [place].

## 2017-05-29 NOTE — ED Notes (Addendum)
MD little cleared pt in triage room and pt taken to CT by [facility].

## 2017-05-29 NOTE — H&P (Signed)
History and Physical    Emma Duran:[id number] DOB: Nov 09, 1955 DOA: 05/29/2017  PCP: Mayra Neer, MD Patient coming from: home  Chief Complaint: left facial/arm numbness  HPI: Emma Duran is a very pleasant 61 y.o. female with medical history significant for diabetes, hypertension, hyperlipidemia presents to the emergency department with the chief complaint sudden onset left facial and left arm numbness and tingling. Initial evaluations include an MRI revealing acute nonhemorrhagic infarct of the lateral right thalamus. [facility] are asked to admit  Information is obtained from the patient and the chart. She states she was in her usual state of health until this morning she was getting ready for work and she noted a that the left side of her face was "not my". She also noted some numbness and tingling of her left handand arm. Associated with this she had "fireworks" in her visual field mostly in the left eye. She denies any headache dizziness syncope or near-syncope. She denies anydifficulty chewing swallowing or speaking. She denies any numbness or tingling of her left leg or any gait disturbance. Reports she finished getting ready for work and drove her self to work. She states when she arrived worker coworkers noted that "she didn't what good". He also reports a one she got to work she felt a little "off balance".She denies chest pain palpitation shortness breath diaphoresis nausea vomiting diarrhea constipation melena bright red blood per rectum. She denies any dysuria hematuria frequency or urgency. She reports compliance with all her medications.Has no history of stroke or TIA.    ED Course: in the emergency department she's afebrile hemodynamically stable and not hypoxic. She is evaluated by the stroke team who recommended admission for stroke workup.  Review of Systems: As per HPI otherwise all other systems reviewed and are negative.   Ambulatory Status:ambulates  independently and is independent with ADLs  Past Medical History:  Diagnosis Date  . Abnormal breast exam    chip in right breast as marker for abnormality   . Arthritis    knee, hands  . Diabetes mellitus    type 2  . External hemorrhoids   . GERD (gastroesophageal reflux disease)   . Heart murmur    never has caused any problems  . History of colon polyps   . History of kidney stones    surgery done  . Hyperlipidemia   . Hypertension   . Internal hemorrhoids     Past Surgical History:  Procedure Laterality Date  . ABDOMINAL HYSTERECTOMY    . BREAST LUMPECTOMY  09/28/10   right breast   . COLONOSCOPY    . ESOPHAGOGASTRODUODENOSCOPY    . [place]  2010  . KNEE ARTHROSCOPY WITH MEDIAL MENISECTOMY Right 09/30/2013   Procedure: Right knee arthroscopy partial meniscectomy, debridement;  Surgeon: Nita Sells, MD;  Location: [facility];  Service: Orthopedics;  Laterality: Right;  Right knee arthroscopy partial menisectomy, debridement  . WISDOM TOOTH EXTRACTION      Social History   Socioeconomic History  . Marital status: Married    Spouse name: Not on file  . Number of children: 1  . Years of education: Not on file  . Highest education level: Not on file  Occupational History  . Occupation: OFFICE MANAGER    Employer: [employer]  Social Needs  . Financial resource strain: Not on file  . Food insecurity:    Worry: Not on file    Inability: Not on file  .  Transportation needs:    Medical: Not on file    Non-medical: Not on file  Tobacco Use  . Smoking status: Never Smoker  . Smokeless tobacco: Never Used  Substance and Sexual Activity  . Alcohol use: No    Alcohol/week: 0.0 oz  . Drug use: No  . Sexual activity: Yes    Birth control/protection: Post-menopausal, None    Comment: hysterectomy  Lifestyle  . Physical activity:    Days per week: Not on file    Minutes per session: Not on file  . Stress: Not on  file  Relationships  . Social connections:    Talks on phone: Not on file    Gets together: Not on file    Attends religious service: Not on file    Active member of club or organization: Not on file    Attends meetings of clubs or organizations: Not on file    Relationship status: Not on file  . Intimate partner violence:    Fear of current or ex partner: Not on file    Emotionally abused: Not on file    Physically abused: Not on file    Forced sexual activity: Not on file  Other Topics Concern  . Not on file  Social History Narrative  . Not on file    Allergies  Allergen Reactions  . Omeprazole     Full body hives and itching   . Morphine   . Percocet [Oxycodone-Acetaminophen] Other (See Comments)    Patient states "Pain med doesn't work"  . Tramadol   . Morphine And Related Anxiety    Family History  Problem Relation Age of Onset  . Diabetes Mother   . Lung cancer Father   . Cancer Father 65       lung cancer   . Lung cancer Paternal Grandmother   . Cancer Paternal Grandfather   . Hypertension Unknown   . Colon cancer Neg Hx   . Stomach cancer Neg Hx   . Esophageal cancer Neg Hx   . Colon polyps Neg Hx   . Rectal cancer Neg Hx     Prior to Admission medications   Medication Sig Start Date End Date Taking? Authorizing Provider  atorvastatin (LIPITOR) 40 MG tablet Take 40 mg by mouth daily.   Yes [provider]  BYDUREON BCISE 2 MG/0.85ML AUIJ Inject 2 mg as directed once a week. 05/23/17  Yes [provider]  estradiol (ESTRACE) 0.5 MG tablet Take 0.5 mg by mouth daily.    Yes [provider]  lisinopril-hydrochlorothiazide (PRINZIDE,ZESTORETIC) 10-12.5 MG per tablet Take 1 tablet by mouth daily.     Yes [provider]  Magnesium 250 MG TABS Take by mouth. 1 time daily   Yes [provider]  metFORMIN (GLUMETZA) 1000 MG (MOD) 24 hr tablet Take 1,000 mg by mouth 2 (two) times daily with a meal.    Yes [provider]  naproxen (NAPROSYN) 500 MG tablet Take 500 mg by mouth daily.    Yes [provider]  Potassium Bicarbonate 99 MG CAPS Take 99 mg by mouth daily.   Yes [provider]  TRESIBA FLEXTOUCH 100 UNIT/ML SOPN FlexTouch Pen Inject 20 Units as directed daily. 03/26/17  Yes [provider]  VITAMIN D, ERGOCALCIFEROL, PO Take 2,000 Int'l Units by mouth daily.    Yes [provider]    Physical Exam: Vitals:   05/29/17 1045 05/29/17 1100 05/29/17 1115 05/29/17 1130  BP: Marland Kitchen)  148/72 (!) 152/80 (!) 154/73 (!) 146/75  Pulse: 72 70 70 69  Resp: 12 12 12 13   Temp:      TempSrc:      SpO2: 99% 100% 100% 100%     General:  Appears calm and comfortable lying in bed in no acute distress Eyes:  PERRL, EOMI, normal lids, iris ENT:  grossly normal hearing, lips & tongue, mucous membranes of her mouth are moist and pink Neck:  no LAD, masses or thyromegaly Cardiovascular:  RRR, no m/r/g. No LE edema. Pedal pulses present and palpable Respiratory:  CTA bilaterally, no w/r/r. Normal respiratory effort. Abdomen:  soft, ntnd, positive bowel sounds throughout no guarding or rebounding Skin:  no rash or induration seen on limited exam Musculoskeletal:  grossly normal tone BUE/BLE, good ROM, no bony abnormality Psychiatric:  grossly normal mood and affect, speech fluent and appropriate, AOx3 Neurologic:  lert and oriented 3 speech clear facial symmetry tongue midline, no pronator drift bilateral  grip 5 out of 5 lower extremity strength 5 out of 5 increase sensation left face  Labs on Admission: I have personally reviewed following labs and imaging studies  CBC: Recent Labs  Lab 05/29/17 0903 05/29/17 0917  WBC 8.6  --   NEUTROABS 4.3  --   HGB 13.3 13.6  HCT 40.9 40.0  MCV 91.5  --   PLT 303  --    Basic Metabolic Panel: Recent Labs  Lab 05/29/17 0903 05/29/17 0917  NA 137 137  K 3.9 4.0  CL 102 101  CO2 24  --   GLUCOSE 205* 200*  BUN 10  11  CREATININE 0.76 0.70  CALCIUM 9.3  --    GFR: CrCl cannot be calculated (Unknown ideal weight.). Liver Function Tests: Recent Labs  Lab 05/29/17 0903  AST 21  ALT 29  ALKPHOS 47  BILITOT 0.9  PROT 7.3  ALBUMIN 3.9   No results for input(s): LIPASE, AMYLASE in the last 168 hours. No results for input(s): AMMONIA in the last 168 hours. Coagulation Profile: Recent Labs  Lab 05/29/17 0903  INR 0.93   Cardiac Enzymes: No results for input(s): CKTOTAL, CKMB, CKMBINDEX, TROPONINI in the last 168 hours. BNP (last 3 results) No results for input(s): PROBNP in the last 8760 hours. HbA1C: No results for input(s): HGBA1C in the last 72 hours. CBG: Recent Labs  Lab 05/29/17 0927  GLUCAP 188*   Lipid Profile: No results for input(s): CHOL, HDL, LDLCALC, TRIG, CHOLHDL, LDLDIRECT in the last 72 hours. Thyroid Function Tests: No results for input(s): TSH, T4TOTAL, FREET4, T3FREE, THYROIDAB in the last 72 hours. Anemia Panel: No results for input(s): VITAMINB12, FOLATE, FERRITIN, TIBC, IRON, RETICCTPCT in the last 72 hours. Urine analysis:    Component Value Date/Time   COLORURINE YELLOW 05/29/2017 [place] 05/29/2017 1202   LABSPEC 1.010 05/29/2017 1202   PHURINE 5.0 05/29/2017 1202   GLUCOSEU >=500 (A) 05/29/2017 1202   HGBUR NEGATIVE 05/29/2017 1202   BILIRUBINUR NEGATIVE 05/29/2017 1202   KETONESUR NEGATIVE 05/29/2017 1202   PROTEINUR NEGATIVE 05/29/2017 1202   UROBILINOGEN 0.2 09/20/2010 0850   NITRITE NEGATIVE 05/29/2017 1202   LEUKOCYTESUR NEGATIVE 05/29/2017 1202    Creatinine Clearance: CrCl cannot be calculated (Unknown ideal weight.).  Sepsis Labs: @LABRCNTIP (procalcitonin:4,lacticidven:4) )No results found for this or any previous visit (from the past 240 hour(s)).   Radiological Exams on Admission: Mr Brain Wo Contrast (neuro Protocol)  Result Date: 05/29/2017 CLINICAL DATA:  Acute onset of left-sided  facial numbness. Clumsiness of  the left upper and lower extremity. EXAM: MRI HEAD WITHOUT CONTRAST TECHNIQUE: Multiplanar, multiecho pulse sequences of the brain and surrounding structures were obtained without intravenous contrast. COMPARISON:  CT head 05/29/2017.  CT head 05/07/2012. FINDINGS: Brain: The diffusion-weighted images demonstrate acute/subacute nonhemorrhagic infarct involving the posterior limb of the right internal capsule and in the lateral thalamus. Subtle T2 signal changes are associated. Dilated perivascular space is noted just below this level. No other significant white matter disease or other infarcts are present. Mild generalized atrophy is noted. Ventricles are of normal size. No significant extra-axial fluid collection is present. The internal auditory canals are within normal limits bilaterally. The brainstem and cerebellum are normal. Vascular: Flow is present in the major intracranial arteries. Skull and upper cervical spine: Skull base is within normal limits. The craniocervical junction is normal. The upper cervical spine is within normal limits. A benign scalp lipoma is noted anteriorly on the left. Sinuses/Orbits: Small polyps or mucous retention cysts are noted inferiorly in the maxillary sinuses bilaterally. There is opacification of anterior right ethmoid air cells. The frontal and sphenoid sinuses are clear. The mastoid air cells are clear. Globes and orbits are within normal limits. IMPRESSION: 1. Acute nonhemorrhagic infarct of the lateral right thalamus and posterior limb right internal capsule measures up to 8 mm. 2. MRI otherwise normal for age. These results were called by telephone at the time of interpretation on 05/29/2017 at 1:46 pm to Dr. Roland Rack, who verbally acknowledged these results. Electronically Signed   By: San Morelle M.D.   On: 05/29/2017 13:46   Ct Head Code Stroke Wo Contrast  Result Date: 05/29/2017 CLINICAL DATA:  Code stroke. Left-sided numbness. Last seen  normal 0700 hours EXAM: CT HEAD WITHOUT CONTRAST TECHNIQUE: Contiguous axial images were obtained from the base of the skull through the vertex without intravenous contrast. COMPARISON:  12/07/2012 . FINDINGS: Brain: Mild age related atrophy as seen previously. No sign of acute infarction, mass lesion, hemorrhage, hydrocephalus or extra-axial collection. Vascular: There is atherosclerotic calcification of the major vessels at the base of the brain. Skull: Negative Sinuses/Orbits: Clear/normal Other: None ASPECTS ([place] Stroke Program Early CT Score) - Ganglionic level infarction (caudate, lentiform nuclei, internal capsule, insula, M1-M3 cortex): 7 - Supraganglionic infarction (M4-M6 cortex): 3 Total score (0-10 with 10 being normal): 10 IMPRESSION: 1. No acute finding. Mild age related atrophy. Extensive atherosclerotic calcification of the major vessels at the base of the brain. 2. ASPECTS is 10. 3. These results were communicated to Dr. Leonel Ramsay at [facility] 5/28/2019by text page via the [facility] messaging system. Electronically Signed   By: Nelson Chimes M.D.   On: 05/29/2017 09:23    EKG: Independently reviewed. Accelerated junctional rhythm Low voltage, precordial leads Prolonged QT interval  Assessment/Plan Principal Problem:   Stroke [facility]) Active Problems:   Diabetes ([place])   Essential hypertension   Hyperlipidemia   Esophageal stricture   GERD (gastroesophageal reflux disease)   #1. Stroke. Risk factors include hypertension, diabetes, hyperlipidemia. MRI reveals a acute nonhemorrhagic infarct right. CT of the head without any abnormalities. EEG without abnormalities. she was evaluated by the stroke team who opined no TPA given minimal symptoms are resolving. They recommended admission for stroke workup. -Admit to telemetry -CTA head and neck -Echocardiogram -chest x-ray -Lipid panel and hemoglobin A1c -PT consult, OT consult, speech consult -Frequent neuro checks -Aspirin and Plavix  for 3 weeks per neurology -continue her home statin  #2. Hypertension. ontrolled in  the emergency department. Home medications include lisinopril and hydrochlorothiazide. -will hold home meds for now -Monitor blood pressure - resume home meds as indicated  #3. Diabetes. Serum glucose 200 on presentation. Home medications include metformin and tresbiba. She states she's had her chest via this morning. -Obtain a hemoglobin A1c -Hold metformin -Continue Tresbia slightly lower dose -Monitor  #4. Hyperlipidemia. -Follow lipid panel -Continue statin  #5. GERD. Stable at baseline -PPI   DVT prophylaxis: plavix and aspirin  Code Status: full  Family Communication: none present  Disposition Plan: home hopefully tomorrow  Consults called: [facility]  Admission status: inpatient    Radene Gunning MD [facility]  If 7PM-7AM, please contact night-coverage [email or web address] Password [facility]  05/29/2017, 2:29 PM

## 2017-05-29 NOTE — Consult Note (Addendum)
Requesting Physician: Dr. Rex Kras    Chief Complaint: Visual abnormalities with left-sided tingling  History obtained from:  Patient    HPI:                                                                                                                                         Emma Duran is an 62 y.o. female with history of diabetes, hypertension and hyperlipidemia.  Patient states that she awoke this morning was fine until 7 AM.  At that time she noted "fireworks" in her visual field mostly in the left eye.  She also noted that she had decreased sensation in the left face, left arm, and seemed to be not able to work her left leg as she usually does.  For that reason patient is brought to the ED but not by EMS.  Code stroke was called.  Initial CT did not show any abnormalities.  On initial consultation from neurology she did show some decreased sensation on the face, left arm-she stated this was improving, but did not have any decreased sensation in her left leg.  She did show decreased strength in her left arm and left leg.  Blood glucose was 188.  Patient states that she has no history of seizures or TIA/stroke in the past.    Date last known well: Date: 05/29/2017 Time last known well: Time: 07:00 tPA Given: No: Minimal symptoms and resolving symptoms Modified Rankin: Rankin Score=0 NIH stroke scale of 1   Past Medical History:  Diagnosis Date  . Abnormal breast exam    chip in right breast as marker for abnormality   . Arthritis    knee, hands  . Diabetes mellitus    type 2  . External hemorrhoids   . GERD (gastroesophageal reflux disease)   . Heart murmur    never has caused any problems  . History of colon polyps   . History of kidney stones    surgery done  . Hyperlipidemia   . Hypertension   . Internal hemorrhoids     Past Surgical History:  Procedure Laterality Date  . ABDOMINAL HYSTERECTOMY    . BREAST LUMPECTOMY  09/28/10   right breast   . COLONOSCOPY    .  ESOPHAGOGASTRODUODENOSCOPY    . [place]  2010  . KNEE ARTHROSCOPY WITH MEDIAL MENISECTOMY Right 09/30/2013   Procedure: Right knee arthroscopy partial meniscectomy, debridement;  Surgeon: Nita Sells, MD;  Location: [facility];  Service: Orthopedics;  Laterality: Right;  Right knee arthroscopy partial menisectomy, debridement  . WISDOM TOOTH EXTRACTION      Family History  Problem Relation Age of Onset  . Diabetes Mother   . Lung cancer Father   . Cancer Father 50       lung cancer   . Lung cancer Paternal Grandmother   . Cancer Paternal Grandfather   .  Hypertension Unknown   . Colon cancer Neg Hx   . Stomach cancer Neg Hx   . Esophageal cancer Neg Hx   . Colon polyps Neg Hx   . Rectal cancer Neg Hx          Social History:  reports that she has never smoked. She has never used smokeless tobacco. She reports that she does not drink alcohol or use drugs.  Allergies:  Allergies  Allergen Reactions  . Omeprazole     Full body hives and itching   . Morphine   . Percocet [Oxycodone-Acetaminophen] Other (See Comments)    Patient states "Pain med doesn't work"  . Tramadol   . Morphine And Related Anxiety    Medications:                                                                                                                           No current facility-administered medications for this encounter.    Current Outpatient Medications  Medication Sig Dispense Refill  . atorvastatin (LIPITOR) 40 MG tablet Take 40 mg by mouth daily.    . Canagliflozin (INVOKANA) 300 MG TABS Take by mouth.    . estradiol (ESTRACE) 0.5 MG tablet Take 1 mg by mouth daily.     Marland Kitchen glimepiride (AMARYL) 4 MG tablet Take 4 mg by mouth daily before breakfast.     . lisinopril-hydrochlorothiazide (PRINZIDE,ZESTORETIC) 10-12.5 MG per tablet Take 1 tablet by mouth daily.      . Magnesium 250 MG TABS Take by mouth. 1 time daily    . metFORMIN (GLUMETZA) 1000 MG (MOD)  24 hr tablet Take 1,000 mg by mouth 2 (two) times daily with a meal.     . naproxen (NAPROSYN) 500 MG tablet Take 500 mg by mouth 2 (two) times daily with a meal.    . potassium citrate (UROCIT-K) 10 MEQ (1080 MG) SR tablet Take 10 mEq by mouth 6 (six) times daily.     Marland Kitchen VITAMIN D, ERGOCALCIFEROL, PO Take 2,000 Int'l Units by mouth daily.        ROS:                                                                                                                                       History obtained from the patient  General ROS: negative for - chills, fatigue, fever, night sweats,  weight gain or weight loss Psychological ROS: negative for - , hallucinations, memory difficulties, mood swings or  Ophthalmic ROS: Positive for - blurry vision, ENT ROS: negative for - epistaxis, nasal discharge, oral lesions, sore throat, tinnitus or vertigo Respiratory ROS: negative for - cough,  shortness of breath or wheezing Cardiovascular ROS: negative for - chest pain, dyspnea on exertion,  Gastrointestinal ROS: negative for - abdominal pain, diarrhea,  nausea/vomiting or stool incontinence Genito-Urinary ROS: negative for - dysuria, hematuria, incontinence or urinary frequency/urgency Musculoskeletal ROS: Positive for - muscular weakness Neurological ROS: as noted in HPI   General Examination:                                                                                                      Blood pressure (!) 175/94, pulse 83, temperature 97.7 F (36.5 C), temperature source Oral, resp. rate 16, SpO2 100 %.  HEENT-  Normocephalic, no lesions, without obvious abnormality.  Normal external eye and conjunctiva.   Cardiovascular- S1-S2 audible, pulses palpable throughout   Lungs-no rhonchi or wheezing noted, no excessive working breathing.  Saturations within normal limits Abdomen- All 4 quadrants palpated and nontender Extremities- Warm, dry and intact Musculoskeletal-no joint tenderness, deformity or  swelling Skin-warm and dry, no hyperpigmentation, vitiligo, or suspicious lesions  Neurological Examination Mental Status: Alert, oriented, thought content appropriate.  Speech fluent without evidence of aphasia.  Able to follow 3 step commands without difficulty. Cranial Nerves: II:  Visual Wallner grossly normal,  III,IV, VI: ptosis not present, extra-ocular motions intact bilaterally, pupils equal, round, reactive to light and accommodation V,VII: smile symmetric, facial light touch sensation shows decreased sensation on the left VIII: hearing normal bilaterally IX,X: uvula rises symmetrically XI: bilateral shoulder shrug XII: midline tongue extension Motor: Right : Upper extremity   5/5    Left:     Upper extremity   4+/5  Lower extremity   5/5     Lower extremity   4+/5 Tone and bulk:normal tone throughout; no atrophy noted Sensory: Decreased sensation in the left face and left arm Deep Tendon Reflexes: 2+ and symmetric throughout Plantars: Right: downgoing   Left: downgoing Cerebellar: normal finger-to-nose, and normal heel-to-shin test Gait: Not tested   Lab Results: Basic Metabolic Panel: Recent Labs  Lab 05/29/17 0917  NA 137  K 4.0  CL 101  GLUCOSE 200*  BUN 11  CREATININE 0.70    CBC: Recent Labs  Lab 05/29/17 0917  HGB 13.6  HCT 40.0    Lipid Panel: No results for input(s): CHOL, TRIG, HDL, CHOLHDL, VLDL, LDLCALC in the last 168 hours.  CBG: No results for input(s): GLUCAP in the last 168 hours.  Imaging: Ct Head Code Stroke Wo Contrast  Result Date: 05/29/2017 CLINICAL DATA:  Code stroke. Left-sided numbness. Last seen normal 0700 hours EXAM: CT HEAD WITHOUT CONTRAST TECHNIQUE: Contiguous axial images were obtained from the base of the skull through the vertex without intravenous contrast. COMPARISON:  12/07/2012 . FINDINGS: Brain: Mild age related atrophy as seen previously. No sign of acute infarction, mass lesion, hemorrhage, hydrocephalus or  extra-axial collection. Vascular: There is atherosclerotic calcification of the major vessels at the base of the brain. Skull: Negative Sinuses/Orbits: Clear/normal Other: None ASPECTS ([place] Stroke Program Early CT Score) - Ganglionic level infarction (caudate, lentiform nuclei, internal capsule, insula, M1-M3 cortex): 7 - Supraganglionic infarction (M4-M6 cortex): 3 Total score (0-10 with 10 being normal): 10 IMPRESSION: 1. No acute finding. Mild age related atrophy. Extensive atherosclerotic calcification of the major vessels at the base of the brain. 2. ASPECTS is 10. 3. These results were communicated to Dr. Leonel Ramsay at [facility] 5/28/2019by text page via the [facility] messaging system. Electronically Signed   By: Nelson Chimes M.D.   On: 05/29/2017 09:23    Assessment and plan discussed with with attending physician and they are in agreement.    Etta Quill PA-C [facility] [phone]  05/29/2017, 9:27 AM   I have seen the patient and reviewed the above note.   Assessment: 62 y.o. female presenting to the hospital secondary to new onset of tingling and flashing lights on her left side.    Due to her symptoms being more positive symptoms and negative symptoms make ischemic stroke less likely.  The duration and character of the symptoms would argue against seizure, but without a clear history of migraine I do think that this needs to be considered.   Also given that the symptoms are predominantly positive, I think that stroke/TIA is less likely but will still evaluate with MRI.  Stroke Risk Factors - diabetes mellitus, hyperlipidemia and hypertension  Recommend -MRI brain--without contrast -EEG   Roland Rack, MD [facility] [phone]  If 7pm- 7am, please page neurology on call as listed in [place].  MRI shows acute infarct in the thalamus.   1. HgbA1c, fasting lipid panel 2. CTA head and neck.  3. Frequent neuro checks 4. Echocardiogram 5.  Carotid dopplers are not needed given CTA 6. Prophylactic therapy-Antiplatelet med: Aspirin - dose 325mg  PO or 300mg  PR as well as plavix for three weeks, followed by monotherapy.  7. Risk factor modification 8. Telemetry monitoring 9. PT consult, OT consult, Speech consult 10. please page stroke NP  Or  PA  Or MD  from 8am -4 pm as this patient will be followed by the stroke team at this point.   You can look them up on [email or web address]     Roland Rack, MD [facility] [phone]  If 7pm- 7am, please page neurology on call as listed in [place].

## 2017-05-29 NOTE — Code Documentation (Signed)
62 yo Female coming from work with complaints of left facial and arm numbness that started at 0700. Pt reports waking up at 0600 and feeling no abnormalities. Pt fed her cat and then got in the shower. When she was in the shower, pt noted a sudden onset of numbness to her face and left arm. Pt went to work and talked with a co-worker. Co-worker encouraged patient to come to the ED. Pt arrived POV and triage nurse called Code Stroke. Stroke Team met patient in the CT Scanner. Initial NIHSS 1 due to mild decreased sensory on the left side of face and arm. CT Negative. Neurology examined pt. Pt to have EEG and MRI. Handoff given to [place], Therapist, sports.

## 2017-05-29 NOTE — ED Notes (Signed)
Pt to CT  Via str

## 2017-05-29 NOTE — ED Notes (Signed)
To MRI

## 2017-05-29 NOTE — ED Notes (Signed)
eeg here setting up

## 2017-05-29 NOTE — ED Notes (Signed)
Pt received from CT with neurologist  In attendance, pt states s/much better, states no h/a since  7a had sudden onset of face and  And left arm numbness, still has tingling in left arm but other s/s have resolved , pt aaox4, mae w/ purpose at this time

## 2017-05-29 NOTE — Progress Notes (Signed)
Patient arrived to unit 1615, Q2 vitals to end at 9pm tonight

## 2017-05-29 NOTE — Progress Notes (Signed)
EEG complete - results pending 

## 2017-05-29 NOTE — ED Provider Notes (Signed)
[facility] EMERGENCY DEPARTMENT Provider Note   CSN: [id number] Arrival date & time: 05/29/17  0857     History   Chief Complaint Chief Complaint  Patient presents with  . Code Stroke    HPI Emma Duran is a 62 y.o. female.  62 year old female with past medical history below including hypertension, hyperlipidemia, type 2 diabetes mellitus, GERD who presents with left-sided numbness.  At 7 AM, the patient had a sudden onset of flashing lights in her left eye that she describes as "like fireworks."  Around the same time she began having left-sided numbness involving her left face and left extremities.  She has also noticed that her left hand feels slightly weaker. She felt off balance at work and her boss instructed her to seek medical attention.  This has never happened before.  She denies any associated headache, chest pain, SOB, N/V, problems with speech, or recent illness. No h/o TIA/stroke. No alcohol, tobacco, or drug use.  The history is provided by the patient.    Past Medical History:  Diagnosis Date  . Abnormal breast exam    chip in right breast as marker for abnormality   . Arthritis    knee, hands  . Diabetes mellitus    type 2  . External hemorrhoids   . GERD (gastroesophageal reflux disease)   . Heart murmur    never has caused any problems  . History of colon polyps   . History of kidney stones    surgery done  . Hyperlipidemia   . Hypertension   . Internal hemorrhoids     Patient Active Problem List   Diagnosis Date Noted  . Dysphagia, pharyngoesophageal phase 11/08/2012  . Esophageal stricture 11/08/2012  . GERD (gastroesophageal reflux disease) 11/08/2012  . Unspecified gastritis and gastroduodenitis without mention of hemorrhage 11/08/2012  . DIABETES MELLITUS, TYPE II 05/28/2010  . HYPERLIPIDEMIA 05/28/2010  . HYPERTENSION 05/28/2010    Past Surgical History:  Procedure Laterality Date  . ABDOMINAL HYSTERECTOMY    .  BREAST LUMPECTOMY  09/28/10   right breast   . COLONOSCOPY    . ESOPHAGOGASTRODUODENOSCOPY    . [place]  2010  . KNEE ARTHROSCOPY WITH MEDIAL MENISECTOMY Right 09/30/2013   Procedure: Right knee arthroscopy partial meniscectomy, debridement;  Surgeon: Nita Sells, MD;  Location: [facility];  Service: Orthopedics;  Laterality: Right;  Right knee arthroscopy partial menisectomy, debridement  . WISDOM TOOTH EXTRACTION       OB History   None      Home Medications    Prior to Admission medications   Medication Sig Start Date End Date Taking? Authorizing Provider  atorvastatin (LIPITOR) 40 MG tablet Take 40 mg by mouth daily.   Yes [provider]  BYDUREON BCISE 2 MG/0.85ML AUIJ Inject 2 mg as directed once a week. 05/23/17  Yes [provider]  estradiol (ESTRACE) 0.5 MG tablet Take 0.5 mg by mouth daily.    Yes [provider]  lisinopril-hydrochlorothiazide (PRINZIDE,ZESTORETIC) 10-12.5 MG per tablet Take 1 tablet by mouth daily.     Yes [provider]  Magnesium 250 MG TABS Take by mouth. 1 time daily   Yes [provider]  metFORMIN (GLUMETZA) 1000 MG (MOD) 24 hr tablet Take 1,000 mg by mouth 2 (two) times daily with a meal.    Yes [provider]  naproxen (NAPROSYN) 500 MG tablet Take 500 mg by mouth daily.    Yes [provider]  Potassium Bicarbonate 99 MG CAPS Take 99 mg by mouth daily.   Yes [provider]  TRESIBA FLEXTOUCH 100 UNIT/ML SOPN FlexTouch Pen Inject 20 Units as directed daily. 03/26/17  Yes [provider]  VITAMIN D, ERGOCALCIFEROL, PO Take 2,000 Int'l Units by mouth daily.    Yes [provider]    Family History Family History  Problem Relation Age of Onset  . Diabetes Mother   . Lung cancer Father   . Cancer Father 41       lung cancer   . Lung cancer Paternal Grandmother   . Cancer Paternal Grandfather   . Hypertension  Unknown   . Colon cancer Neg Hx   . Stomach cancer Neg Hx   . Esophageal cancer Neg Hx   . Colon polyps Neg Hx   . Rectal cancer Neg Hx     Social History Social History   Tobacco Use  . Smoking status: Never Smoker  . Smokeless tobacco: Never Used  Substance Use Topics  . Alcohol use: No    Alcohol/week: 0.0 oz  . Drug use: No     Allergies   Omeprazole; Morphine; Percocet [oxycodone-acetaminophen]; Tramadol; and Morphine and related   Review of Systems Review of Systems All other systems reviewed and are negative except that which was mentioned in HPI   Physical Exam Updated Vital Signs BP (!) 146/75   Pulse 69   Temp 98.5 F (36.9 C)   Resp 13   SpO2 100%   Physical Exam  Constitutional: She is oriented to person, place, and time. She appears well-developed and well-nourished. No distress.  Awake, alert  HENT:  Head: Normocephalic and atraumatic.  Eyes: Pupils are equal, round, and reactive to light. Conjunctivae and EOM are normal.  Neck: Neck supple.  Cardiovascular: Normal rate, regular rhythm and normal heart sounds.  No murmur heard. Pulmonary/Chest: Effort normal and breath sounds normal. No respiratory distress.  Abdominal: Soft. Bowel sounds are normal. She exhibits no distension. There is no tenderness.  Musculoskeletal: She exhibits no edema.  Neurological: She is alert and oriented to person, place, and time. She has normal reflexes. A sensory deficit is present. No cranial nerve deficit. She exhibits normal muscle tone.  Fluent speech, normal finger-to-nose testing, negative pronator drift, normal gait 5/5 strength x all 4 extremities Decreased sensation L face, L arm; intact sensation RUE, BLE, R face  Skin: Skin is warm and dry.  Psychiatric: She has a normal mood and affect. Judgment and thought content normal.  Nursing note and vitals reviewed.    ED Treatments / Results  Labs (all labs ordered are listed, but only abnormal results are  displayed) Labs Reviewed  COMPREHENSIVE METABOLIC PANEL - Abnormal; Notable for the following components:      Result Value   Glucose, Bld 205 (*)    All other components within normal limits  URINALYSIS, ROUTINE W REFLEX MICROSCOPIC - Abnormal; Notable for the following components:   Glucose, UA >=500 (*)    Bacteria, UA FEW (*)    All other components within normal limits  I-STAT CHEM 8, ED - Abnormal; Notable for the following components:   Glucose, Bld 200 (*)    Calcium, Ion 1.14 (*)    All other components within normal limits  CBG MONITORING, ED - Abnormal; Notable for the following components:   Glucose-Capillary 188 (*)    All other components within normal limits  ETHANOL  PROTIME-INR  APTT  CBC  DIFFERENTIAL  RAPID URINE DRUG SCREEN, HOSP PERFORMED  I-STAT TROPONIN, ED    EKG None  Radiology Mr Brain Wo Contrast (neuro Protocol)  Result Date: 05/29/2017 CLINICAL DATA:  Acute onset of left-sided facial numbness. Clumsiness of the left upper and lower extremity. EXAM: MRI HEAD WITHOUT CONTRAST TECHNIQUE: Multiplanar, multiecho pulse sequences of the brain and surrounding structures were obtained without intravenous contrast. COMPARISON:  CT head 05/29/2017.  CT head 05/07/2012. FINDINGS: Brain: The diffusion-weighted images demonstrate acute/subacute nonhemorrhagic infarct involving the posterior limb of the right internal capsule and in the lateral thalamus. Subtle T2 signal changes are associated. Dilated perivascular space is noted just below this level. No other significant white matter disease or other infarcts are present. Mild generalized atrophy is noted. Ventricles are of normal size. No significant extra-axial fluid collection is present. The internal auditory canals are within normal limits bilaterally. The brainstem and cerebellum are normal. Vascular: Flow is present in the major intracranial arteries. Skull and upper cervical spine: Skull base is within normal  limits. The craniocervical junction is normal. The upper cervical spine is within normal limits. A benign scalp lipoma is noted anteriorly on the left. Sinuses/Orbits: Small polyps or mucous retention cysts are noted inferiorly in the maxillary sinuses bilaterally. There is opacification of anterior right ethmoid air cells. The frontal and sphenoid sinuses are clear. The mastoid air cells are clear. Globes and orbits are within normal limits. IMPRESSION: 1. Acute nonhemorrhagic infarct of the lateral right thalamus and posterior limb right internal capsule measures up to 8 mm. 2. MRI otherwise normal for age. These results were called by telephone at the time of interpretation on 05/29/2017 at 1:46 pm to Dr. Roland Rack, who verbally acknowledged these results. Electronically Signed   By: San Morelle M.D.   On: 05/29/2017 13:46   Ct Head Code Stroke Wo Contrast  Result Date: 05/29/2017 CLINICAL DATA:  Code stroke. Left-sided numbness. Last seen normal 0700 hours EXAM: CT HEAD WITHOUT CONTRAST TECHNIQUE: Contiguous axial images were obtained from the base of the skull through the vertex without intravenous contrast. COMPARISON:  12/07/2012 . FINDINGS: Brain: Mild age related atrophy as seen previously. No sign of acute infarction, mass lesion, hemorrhage, hydrocephalus or extra-axial collection. Vascular: There is atherosclerotic calcification of the major vessels at the base of the brain. Skull: Negative Sinuses/Orbits: Clear/normal Other: None ASPECTS ([place] Stroke Program Early CT Score) - Ganglionic level infarction (caudate, lentiform nuclei, internal capsule, insula, M1-M3 cortex): 7 - Supraganglionic infarction (M4-M6 cortex): 3 Total score (0-10 with 10 being normal): 10 IMPRESSION: 1. No acute finding. Mild age related atrophy. Extensive atherosclerotic calcification of the major vessels at the base of the brain. 2. ASPECTS is 10. 3. These results were communicated to Dr. Leonel Ramsay at  [facility] 5/28/2019by text page via the [facility] messaging system. Electronically Signed   By: Nelson Chimes M.D.   On: 05/29/2017 09:23    Procedures .Critical Care Performed by: Sharlett Iles, MD Authorized by: Sharlett Iles, MD   Critical care provider statement:    Critical care time (minutes):  30   Critical care time was exclusive of:  Separately billable procedures and treating other patients   Critical care was necessary to treat or prevent imminent or life-threatening deterioration of the following conditions:  CNS failure or compromise   Critical care was time spent personally by me on the following activities:  Development of treatment plan with patient or surrogate, discussions with consultants, evaluation of patient's response  to treatment, examination of patient, obtaining history from patient or surrogate, ordering and review of laboratory studies, ordering and review of radiographic studies, re-evaluation of patient's condition and review of old charts   (including critical care time)  Medications Ordered in ED Medications - No data to display   Initial Impression / Assessment and Plan / ED Course  I have reviewed the triage vital signs and the nursing notes.  Pertinent labs & imaging results that were available during my care of the patient were reviewed by me and considered in my medical decision making (see chart for details).    Given time of onset and nature of sx, code stroke called from triage and pt taken to [facility]. Met by neurology stroke team including Dr. Leonel Ramsay.  Head CT negative for hemorrhage.  He determined that based on her symptoms, it was not clear that she was having stroke, ddx includes atypical migraine or partial seizure. He advised no tPA. He ordered stat EEG and brain MRI.   MRI shows acute ischemic infarct in right thalamus and right internal capsule.  Discussed findings with Dr. Leonel Ramsay.  Contacted [facility] for admission,  discussed with Dyanne Carrel.  Patient admitted for stroke work-up. Final Clinical Impressions(s) / ED Diagnoses   Final diagnoses:  Acute ischemic stroke [facility])    ED Discharge Orders    None       Audreanna Torrisi, Wenda Overland, MD 05/29/17 1402

## 2017-05-30 ENCOUNTER — Inpatient Hospital Stay (HOSPITAL_COMMUNITY): Payer: BLUE CROSS/BLUE SHIELD

## 2017-05-30 ENCOUNTER — Encounter (HOSPITAL_COMMUNITY): Payer: Self-pay | Admitting: *Deleted

## 2017-05-30 DIAGNOSIS — I10 Essential (primary) hypertension: Secondary | ICD-10-CM

## 2017-05-30 DIAGNOSIS — I639 Cerebral infarction, unspecified: Principal | ICD-10-CM

## 2017-05-30 DIAGNOSIS — E785 Hyperlipidemia, unspecified: Secondary | ICD-10-CM

## 2017-05-30 DIAGNOSIS — Z794 Long term (current) use of insulin: Secondary | ICD-10-CM

## 2017-05-30 DIAGNOSIS — I6300 Cerebral infarction due to thrombosis of unspecified precerebral artery: Secondary | ICD-10-CM

## 2017-05-30 DIAGNOSIS — I5030 Unspecified diastolic (congestive) heart failure: Secondary | ICD-10-CM

## 2017-05-30 DIAGNOSIS — E118 Type 2 diabetes mellitus with unspecified complications: Secondary | ICD-10-CM

## 2017-05-30 LAB — LIPID PANEL
CHOLESTEROL: 229 mg/dL — AB (ref 0–200)
HDL: 41 mg/dL (ref >40)
LDL Cholesterol: 156 mg/dL — ABNORMAL HIGH (ref 0–99)
TRIGLYCERIDES: 158 mg/dL — AB (ref <150)
Total CHOL/HDL Ratio: 5.6 RATIO
VLDL: 32 mg/dL (ref 0–40)

## 2017-05-30 LAB — HEMOGLOBIN A1C
Hgb A1c MFr Bld: 7.8 % — ABNORMAL HIGH (ref 4.8–5.6)
Mean Plasma Glucose: 177.16 mg/dL

## 2017-05-30 LAB — GLUCOSE, CAPILLARY
GLUCOSE-CAPILLARY: 157 mg/dL — AB (ref 65–99)
Glucose-Capillary: 170 mg/dL — ABNORMAL HIGH (ref 65–99)
Glucose-Capillary: 151 mg/dL — ABNORMAL HIGH (ref 65–99)

## 2017-05-30 LAB — ECHOCARDIOGRAM COMPLETE
HEIGHTINCHES: 66 in
Weight: 2952.4 oz

## 2017-05-30 LAB — HIV ANTIBODY (ROUTINE TESTING W REFLEX): HIV Screen 4th Generation wRfx: NONREACTIVE

## 2017-05-30 MED ORDER — CLOPIDOGREL BISULFATE 75 MG PO TABS: 75.0000 mg | ORAL_TABLET | Freq: Every day | ORAL | 2 refills | Status: DC

## 2017-05-30 MED ORDER — PERFLUTREN LIPID MICROSPHERE
1.0000 mL | INTRAVENOUS | Status: AC | PRN
  Administered 2017-05-30: 10 mL via INTRAVENOUS
  Filled 2017-05-30 (×2): qty 10

## 2017-05-30 MED ORDER — ASPIRIN 81 MG PO TBEC: 81.0000 mg | DELAYED_RELEASE_TABLET | Freq: Every day | ORAL | 0 refills | Status: DC

## 2017-05-30 MED ORDER — ASPIRIN EC 81 MG PO TBEC: 81.0000 mg | DELAYED_RELEASE_TABLET | Freq: Every day | ORAL | Status: DC

## 2017-05-30 NOTE — Progress Notes (Signed)
  Echocardiogram 2D Echocardiogram has been performed.  Kamil Mchaffie G Dierra Riesgo 05/30/2017, 2:57 PM

## 2017-05-30 NOTE — Plan of Care (Signed)
Adequate for discharge.

## 2017-05-30 NOTE — Evaluation (Signed)
Physical Therapy Evaluation Patient Details Name: Emma Duran MRN: [id number] DOB: 1955/07/29 Today's Date: 05/30/2017   History of Present Illness  62 y.o. female with history of diabetes, hypertension and hyperlipidemia admitted with decreased sensation left arm/face.  MRI: Acute nonhemorrhagic infarct of the lateral right thalamus and posterior limb right internal capsule measures up to 8 mm. Pt works full time as a Optometrist in [employer]  Clinical Impression  Patient seen for mobility assessment. Mobilizing well. No further acute PT needs. Will sign off.    Follow Up Recommendations No PT follow up    Equipment Recommendations  None recommended by PT    Recommendations for Other Services       Precautions / Restrictions Precautions Precautions: None Restrictions Weight Bearing Restrictions: No      Mobility  Bed Mobility Overal bed mobility: Independent             General bed mobility comments: no difficulty  Transfers Overall transfer level: Independent Equipment used: None                Ambulation/Gait Ambulation/Gait assistance: Independent Ambulation Distance (Feet): 200 Feet Assistive device: None Gait Pattern/deviations: WFL(Within Functional Limits)   Gait velocity interpretation: >2.62 ft/sec, indicative of community ambulatory General Gait Details: steady with ambulation  Stairs Stairs: Yes Stairs assistance: Modified independent (Device/Increase time) Stair Management: One rail Left;Alternating pattern Number of Stairs: 5    Wheelchair Mobility    Modified Rankin (Stroke Patients Only) Modified Rankin (Stroke Patients Only) Pre-Morbid Rankin Score: No symptoms Modified Rankin: No symptoms     Balance Overall balance assessment: Independent                               Standardized Balance Assessment Standardized Balance Assessment : Dynamic Gait Index   Dynamic Gait Index Level Surface:  Normal Change in Gait Speed: Normal Gait with Horizontal Head Turns: Normal Gait with Vertical Head Turns: Normal Gait and Pivot Turn: Normal Step Over Obstacle: Mild Impairment Step Around Obstacles: Normal Steps: Mild Impairment Total Score: 22       Pertinent Vitals/Pain Pain Assessment: No/denies pain    Home Living Family/patient expects to be discharged to:: Private residence     Type of Home: House Home Access: Stairs to enter   Technical brewer of Steps: 1 Home Layout: Multi-level Home Equipment: Environmental consultant - 2 wheels;Cane - single point;Crutches      Prior Function Level of Independence: Independent               Hand Dominance   Dominant Hand: Right    Extremity/Trunk Assessment   Upper Extremity Assessment Upper Extremity Assessment: Overall WFL for tasks assessed    Lower Extremity Assessment Lower Extremity Assessment: Overall WFL for tasks assessed       Communication   Communication: (wears glassess)  Cognition Arousal/Alertness: Awake/alert Behavior During Therapy: WFL for tasks assessed/performed Overall Cognitive Status: Within Functional Limits for tasks assessed                                        General Comments      Exercises     Assessment/Plan    PT Assessment    PT Problem List         PT Treatment Interventions      PT Goals (Current  goals can be found in the Care Plan section)  Acute Rehab PT Goals PT Goal Formulation: All assessment and education complete, DC therapy    Frequency     Barriers to discharge        Co-evaluation               AM-PAC PT "6 Clicks" Daily Activity  Outcome Measure Difficulty turning over in bed (including adjusting bedclothes, sheets and blankets)?: None Difficulty moving from lying on back to sitting on the side of the bed? : None Difficulty sitting down on and standing up from a chair with arms (e.g., wheelchair, bedside commode, etc,.)?:  None Help needed moving to and from a bed to chair (including a wheelchair)?: None Help needed walking in hospital room?: None Help needed climbing 3-5 steps with a railing? : A Little 6 Click Score: 23    End of Session   Activity Tolerance: Patient tolerated treatment well Patient left: in bed;with call bell/phone within reach(sitting EOB) Nurse Communication: Mobility status      Time: 1142-1200 PT Time Calculation (min) (ACUTE ONLY): 18 min   Charges:   PT Evaluation $PT Eval Low Complexity: 1 Low     PT G Codes:        Alben Deeds, PT DPT  Board Certified Neurologic Specialist [place] 05/30/2017, 12:03 PM

## 2017-05-30 NOTE — Progress Notes (Signed)
Inpatient Diabetes Program Recommendations  AACE/ADA: New Consensus Statement on Inpatient Glycemic Control (2015)  Target Ranges:  Prepandial:   less than 140 mg/dL      Peak postprandial:   less than 180 mg/dL (1-2 hours)      Critically ill patients:  140 - 180 mg/dL   Lab Results  Component Value Date   GLUCAP 170 (H) 05/30/2017   HGBA1C 7.8 (H) 05/30/2017    Review of Glycemic ControlResults for CHEVI, LIM (MRN [id number]) as of 05/30/2017 14:12  Ref. Range 05/29/2017 09:27 05/29/2017 17:45 05/29/2017 21:40 05/30/2017 06:39 05/30/2017 11:08  Glucose-Capillary Latest Ref Range: 65 - 99 mg/dL 188 (H) 117 (H) 144 (H) 157 (H) 170 (H)    Diabetes history: Type 2 DM  Outpatient Diabetes medications:  Bydureon 2 mg once a week, Metformin 1000 mg bid, Tresiba 20 units daily Current orders for Inpatient glycemic control:  Novolog sensitive tid with meals and HS, Levemir 15 units daily  Inpatient Diabetes Program Recommendations:    Spoke with patient regarding DM and DM medications.  She is candidate for Freestyle Libre-CGM transition study.  Gave her handout and sample consent for study for her to consider.  Will follow up this PM regarding whether patient would like to participate.  MD if patient does participate, please order "Freestyle Libre sensors- order [id number]".   Thanks,  Adah Perl, RN, BC-ADM Inpatient Diabetes Coordinator Pager [phone] (8a-5p)

## 2017-05-30 NOTE — Discharge Summary (Signed)
Physician Discharge Summary  [name]:[id number] DOB: 1955-01-04 DOA: 05/29/2017  PCP: Mayra Neer, MD  Admit date: 05/29/2017 Discharge date: 05/30/2017  Admitted From: home Disposition:  home  Recommendations for Outpatient Follow-up:  1. Follow up with PCP in 1-2 weeks 2. Please take Aspirin and Plavix for 3 weeks then Plavix alone 3. Follow up with neurology in 4-6 weeks  Home Health: none Equipment/Devices: none  Discharge Condition: stable CODE STATUS: Full code Diet recommendation: heart healthy, diabetic, low salt   HPI: Per Dyanne Carrel, NP Emma Duran is a very pleasant 62 y.o. female with medical history significant for diabetes, hypertension, hyperlipidemia presents to the emergency department with the chief complaint sudden onset left facial and left arm numbness and tingling. Initial evaluations include an MRI revealing acute nonhemorrhagic infarct of the lateral right thalamus. [facility] are asked to admit. Information is obtained from the patient and the chart. She states she was in her usual state of health until this morning she was getting ready for work and she noted a that the left side of her face was "not my". She also noted some numbness and tingling of her left handand arm. Associated with this she had "fireworks" in her visual field mostly in the left eye. She denies any headache dizziness syncope or near-syncope. She denies anydifficulty chewing swallowing or speaking. She denies any numbness or tingling of her left leg or any gait disturbance. Reports she finished getting ready for work and drove her self to work. She states when she arrived worker coworkers noted that "she didn't what good". He also reports a one she got to work she felt a little "off balance".She denies chest pain palpitation shortness breath diaphoresis nausea vomiting diarrhea constipation melena bright red blood per rectum. She denies any dysuria hematuria frequency or  urgency. She reports compliance with all her medications.Has no history of stroke or TIA.  Hospital Course: Acute CVA - patient was admitted to the hospital with CVA. MRI on admission showed acute nonhemorrhagic infarct of the lateral tight thalamus and posterior limb right internal capsule. Neurology was consulted and followed patient while hospitalized. She underwent CT angiogram of head and neck with diffuse atherosclerotic changes as shown below. Lipid panel showed an LDL of 156, continue statin (did not take it every day prior). HBA1C 7.8, continue diabetic medications and outpatient follow up. Due to reported flashing lights in left eye she underwent an EEG which was normal. 2D echo showed EF 60-65%, grade 1 DD. HTN - resume home medications DM, poorly controlled - will need tight outpatient control HLD - continue statin   Discharge Diagnoses:  Principal Problem:   Stroke [facility]) Active Problems:   Diabetes ([place])   Hyperlipidemia   Essential hypertension   Esophageal stricture   GERD (gastroesophageal reflux disease)     Discharge Instructions  Discharge Instructions    Ambulatory referral to Neurology   Complete by:  As directed    Follow up with stroke clinic NP (Jessica Vanschaick or Cecille Rubin, if both not available, consider Dr. Antony Contras, Dr. Bess Harvest, or Dr. Sarina Ill) at [facility] in about 4 weeks.     Allergies as of 05/30/2017      Reactions   Omeprazole    Full body hives and itching    Morphine    Percocet [oxycodone-acetaminophen] Other (See Comments)   Patient states "Pain med doesn't work"   Tramadol    Morphine And Related Anxiety  Medication List    TAKE these medications   aspirin 81 MG EC tablet Take 1 tablet (81 mg total) by mouth daily. Take for 3 more weeks then stop Start taking on:  05/31/2017   atorvastatin 40 MG tablet Commonly known as:  LIPITOR Take 40 mg by mouth daily.   BYDUREON BCISE 2  MG/0.85ML Auij Generic drug:  Exenatide ER Inject 2 mg as directed once a week.   clopidogrel 75 MG tablet Commonly known as:  PLAVIX Take 1 tablet (75 mg total) by mouth daily. Start taking on:  05/31/2017   estradiol 0.5 MG tablet Commonly known as:  ESTRACE Take 0.5 mg by mouth daily.   lisinopril-hydrochlorothiazide 10-12.5 MG tablet Commonly known as:  PRINZIDE,ZESTORETIC Take 1 tablet by mouth daily.   Magnesium 250 MG Tabs Take by mouth. 1 time daily   metFORMIN 1000 MG (MOD) 24 hr tablet Commonly known as:  GLUMETZA Take 1,000 mg by mouth 2 (two) times daily with a meal.   naproxen 500 MG tablet Commonly known as:  NAPROSYN Take 500 mg by mouth daily.   Potassium Bicarbonate 99 MG Caps Take 99 mg by mouth daily.   TRESIBA FLEXTOUCH 100 UNIT/ML Sopn FlexTouch Pen Generic drug:  insulin degludec Inject 20 Units as directed daily.   VITAMIN D (ERGOCALCIFEROL) PO Take 2,000 Int'l Units by mouth daily.      Follow-up Information    [facility] Follow up in 4 week(s).   Specialty:  Neurology Why:  Stroke clinic.  Office will call with appointment date and time. Contact information: [address] [place] [phone]       Mayra Neer, MD. Schedule an appointment as soon as possible for a visit in 2 week(s).   Specialty:  Family Medicine Contact information: [address] [place] [address] [phone]          Consultations:  Neurology   Procedures/Studies:  2D echo  Impressions: - LVEF 60-65%, normal wall thickness, normal wall motion, grade 1 DD, elevated LV filling pressure, aortic valve sclerosis, normal LA size, normal IVC.   EEG This normal EEG is recorded in the waking and drowsy state. There was no seizure or seizure predisposition recorded on this study. Please note that a normal EEG does not preclude the possibility of epilepsy.     Ct Angio Head W  Or Wo Contrast  Result Date: 05/29/2017 CLINICAL DATA:  Acute stroke right thalamus. Sudden onset of left facial and arm numbness and tingling. EXAM: CT ANGIOGRAPHY HEAD AND NECK TECHNIQUE: Multidetector CT imaging of the head and neck was performed using the standard protocol during bolus administration of intravenous contrast. Multiplanar CT image reconstructions and MIPs were obtained to evaluate the vascular anatomy. Carotid stenosis measurements (when applicable) are obtained utilizing NASCET criteria, using the distal internal carotid diameter as the denominator. CONTRAST:  64mL ISOVUE-370 IOPAMIDOL (ISOVUE-370) INJECTION 76% COMPARISON:  MRI and CT earlier same day. FINDINGS: CTA NECK FINDINGS Aortic arch: Aortic atherosclerosis. No aneurysm or dissection. Branching pattern of the brachiocephalic vessels from the arch is normal. 30-50% stenosis of the left subclavian artery origin. Right carotid system: Common carotid artery is widely patent to the bifurcation. At the bifurcation, there is mild atherosclerotic plaque but no stenosis or irregularity. Cervical ICA is widely patent. Left carotid system: Common carotid artery is widely patent to the bifurcation. Minimal atherosclerotic plaque at the carotid bifurcation and ICA bulb but no stenosis. Vertebral arteries:  Both vertebral arteries are patent. 30% stenosis of both vertebral artery origins. Both vertebral arteries are widely patent beyond that to the foramen magnum. Skeleton: Ordinary cervical spondylosis. Other neck: No soft tissue mass or lymphadenopathy. Upper chest: Negative Review of the MIP images confirms the above findings CTA HEAD FINDINGS Anterior circulation: Both internal carotid arteries are patent through the skull base and siphon regions. There is atherosclerotic calcification in the carotid siphon regions but no stenosis greater than 30%. Supraclinoid internal carotid arteries are widely patent. The anterior and middle cerebral vessels  are patent. There is a 50% stenosis of the distal M1 segment on the right. No stenosis on the left. Both posterior cerebral arteries take fetal origin from the anterior circulation. Posterior circulation: Both vertebral arteries show extensive calcified plaque at the foramen magnum level with severe stenosis but do show flow distal to the stenoses, both vertebral arteries reaching the basilar. Distal vertebral stenoses estimated at 70% or greater on each side. Basilar artery is a small vessel which primarily supplies the superior cerebellar arteries. There appears to be a severe stenosis in the midportion of the basilar. I think there are minor communications between the fetal origin PCAs and the basilar tip, probably giving some collateral flow to the distal basilar. Venous sinuses: Patent and normal. Anatomic variants: None other significant. Delayed phase: No abnormal enhancement. Review of the MIP images confirms the above findings IMPRESSION: Aortic atherosclerosis. 30-50% stenosis of the left subclavian artery origin. Mild atherosclerotic change at the carotid bifurcations without stenosis. Atherosclerotic change in the carotid siphon regions but without stenosis greater than 30%. 50% stenosis of the distal right M1 segment. 30% stenosis at both vertebral artery origins. 70% or greater stenosis at the V3 V4 junction of each vertebral artery. Severe stenosis of the mid basilar, but with distal flow. Both posterior cerebral arteries receive most of there supply from the anterior circulation. There are probably small communications between the posterior cerebral arteries and the basilar tip. Electronically Signed   By: Nelson Chimes M.D.   On: 05/29/2017 15:56   Dg Chest 2 View  Result Date: 05/29/2017 CLINICAL DATA:  Left-sided numbness and tingling of the face and arm since this morning. EXAM: CHEST - 2 VIEW COMPARISON:  PA and lateral chest x-ray dated May 23, 2012 FINDINGS: The lungs are adequately  inflated and clear. The heart and pulmonary vascularity are normal. The mediastinum is normal in width. The trachea is midline. The bony thorax exhibits no acute abnormality. There is calcification in the wall of the aortic arch. IMPRESSION: There is no active cardiopulmonary disease. Thoracic aortic atherosclerosis. Electronically Signed   By: David  Martinique M.D.   On: 05/29/2017 15:18   Ct Angio Neck W Or Wo Contrast  Result Date: 05/29/2017 CLINICAL DATA:  Acute stroke right thalamus. Sudden onset of left facial and arm numbness and tingling. EXAM: CT ANGIOGRAPHY HEAD AND NECK TECHNIQUE: Multidetector CT imaging of the head and neck was performed using the standard protocol during bolus administration of intravenous contrast. Multiplanar CT image reconstructions and MIPs were obtained to evaluate the vascular anatomy. Carotid stenosis measurements (when applicable) are obtained utilizing NASCET criteria, using the distal internal carotid diameter as the denominator. CONTRAST:  78mL ISOVUE-370 IOPAMIDOL (ISOVUE-370) INJECTION 76% COMPARISON:  MRI and CT earlier same day. FINDINGS: CTA NECK FINDINGS Aortic arch: Aortic atherosclerosis. No aneurysm or dissection. Branching pattern of the brachiocephalic vessels from the arch is normal. 30-50% stenosis of the left subclavian artery origin. Right carotid  system: Common carotid artery is widely patent to the bifurcation. At the bifurcation, there is mild atherosclerotic plaque but no stenosis or irregularity. Cervical ICA is widely patent. Left carotid system: Common carotid artery is widely patent to the bifurcation. Minimal atherosclerotic plaque at the carotid bifurcation and ICA bulb but no stenosis. Vertebral arteries: Both vertebral arteries are patent. 30% stenosis of both vertebral artery origins. Both vertebral arteries are widely patent beyond that to the foramen magnum. Skeleton: Ordinary cervical spondylosis. Other neck: No soft tissue mass or  lymphadenopathy. Upper chest: Negative Review of the MIP images confirms the above findings CTA HEAD FINDINGS Anterior circulation: Both internal carotid arteries are patent through the skull base and siphon regions. There is atherosclerotic calcification in the carotid siphon regions but no stenosis greater than 30%. Supraclinoid internal carotid arteries are widely patent. The anterior and middle cerebral vessels are patent. There is a 50% stenosis of the distal M1 segment on the right. No stenosis on the left. Both posterior cerebral arteries take fetal origin from the anterior circulation. Posterior circulation: Both vertebral arteries show extensive calcified plaque at the foramen magnum level with severe stenosis but do show flow distal to the stenoses, both vertebral arteries reaching the basilar. Distal vertebral stenoses estimated at 70% or greater on each side. Basilar artery is a small vessel which primarily supplies the superior cerebellar arteries. There appears to be a severe stenosis in the midportion of the basilar. I think there are minor communications between the fetal origin PCAs and the basilar tip, probably giving some collateral flow to the distal basilar. Venous sinuses: Patent and normal. Anatomic variants: None other significant. Delayed phase: No abnormal enhancement. Review of the MIP images confirms the above findings IMPRESSION: Aortic atherosclerosis. 30-50% stenosis of the left subclavian artery origin. Mild atherosclerotic change at the carotid bifurcations without stenosis. Atherosclerotic change in the carotid siphon regions but without stenosis greater than 30%. 50% stenosis of the distal right M1 segment. 30% stenosis at both vertebral artery origins. 70% or greater stenosis at the V3 V4 junction of each vertebral artery. Severe stenosis of the mid basilar, but with distal flow. Both posterior cerebral arteries receive most of there supply from the anterior circulation. There are  probably small communications between the posterior cerebral arteries and the basilar tip. Electronically Signed   By: Nelson Chimes M.D.   On: 05/29/2017 15:56   Mr Brain Wo Contrast (neuro Protocol)  Result Date: 05/29/2017 CLINICAL DATA:  Acute onset of left-sided facial numbness. Clumsiness of the left upper and lower extremity. EXAM: MRI HEAD WITHOUT CONTRAST TECHNIQUE: Multiplanar, multiecho pulse sequences of the brain and surrounding structures were obtained without intravenous contrast. COMPARISON:  CT head 05/29/2017.  CT head 05/07/2012. FINDINGS: Brain: The diffusion-weighted images demonstrate acute/subacute nonhemorrhagic infarct involving the posterior limb of the right internal capsule and in the lateral thalamus. Subtle T2 signal changes are associated. Dilated perivascular space is noted just below this level. No other significant white matter disease or other infarcts are present. Mild generalized atrophy is noted. Ventricles are of normal size. No significant extra-axial fluid collection is present. The internal auditory canals are within normal limits bilaterally. The brainstem and cerebellum are normal. Vascular: Flow is present in the major intracranial arteries. Skull and upper cervical spine: Skull base is within normal limits. The craniocervical junction is normal. The upper cervical spine is within normal limits. A benign scalp lipoma is noted anteriorly on the left. Sinuses/Orbits: Small polyps or mucous retention cysts  are noted inferiorly in the maxillary sinuses bilaterally. There is opacification of anterior right ethmoid air cells. The frontal and sphenoid sinuses are clear. The mastoid air cells are clear. Globes and orbits are within normal limits. IMPRESSION: 1. Acute nonhemorrhagic infarct of the lateral right thalamus and posterior limb right internal capsule measures up to 8 mm. 2. MRI otherwise normal for age. These results were called by telephone at the time of  interpretation on 05/29/2017 at 1:46 pm to Dr. Roland Rack, who verbally acknowledged these results. Electronically Signed   By: San Morelle M.D.   On: 05/29/2017 13:46   Ct Head Code Stroke Wo Contrast  Result Date: 05/29/2017 CLINICAL DATA:  Code stroke. Left-sided numbness. Last seen normal 0700 hours EXAM: CT HEAD WITHOUT CONTRAST TECHNIQUE: Contiguous axial images were obtained from the base of the skull through the vertex without intravenous contrast. COMPARISON:  12/07/2012 . FINDINGS: Brain: Mild age related atrophy as seen previously. No sign of acute infarction, mass lesion, hemorrhage, hydrocephalus or extra-axial collection. Vascular: There is atherosclerotic calcification of the major vessels at the base of the brain. Skull: Negative Sinuses/Orbits: Clear/normal Other: None ASPECTS ([place] Stroke Program Early CT Score) - Ganglionic level infarction (caudate, lentiform nuclei, internal capsule, insula, M1-M3 cortex): 7 - Supraganglionic infarction (M4-M6 cortex): 3 Total score (0-10 with 10 being normal): 10 IMPRESSION: 1. No acute finding. Mild age related atrophy. Extensive atherosclerotic calcification of the major vessels at the base of the brain. 2. ASPECTS is 10. 3. These results were communicated to Dr. Leonel Ramsay at [facility] 5/28/2019by text page via the [facility] messaging system. Electronically Signed   By: Nelson Chimes M.D.   On: 05/29/2017 09:23     Subjective: - no chest pain, shortness of breath, no abdominal pain, nausea or vomiting. She is back to baseline  Discharge Exam: Vitals:   05/30/17 1108 05/30/17 1519  BP: (!) 146/77 134/80  Pulse: 68 66  Resp: 16 18  Temp: 98.4 F (36.9 C) 98.4 F (36.9 C)  SpO2: 98% 98%    General: Pt is alert, awake, not in acute distress Cardiovascular: RRR, S1/S2 +, no rubs, no gallops Respiratory: CTA bilaterally, no wheezing, no rhonchi Abdominal: Soft, NT, ND, bowel sounds + Extremities: no edema, no  cyanosis    The results of significant diagnostics from this hospitalization (including imaging, microbiology, ancillary and laboratory) are listed below for reference.     Microbiology: No results found for this or any previous visit (from the past 240 hour(s)).   Labs: BNP (last 3 results) No results for input(s): BNP in the last 8760 hours. Basic Metabolic Panel: Recent Labs  Lab 05/29/17 0903 05/29/17 0917  NA 137 137  K 3.9 4.0  CL 102 101  CO2 24  --   GLUCOSE 205* 200*  BUN 10 11  CREATININE 0.76 0.70  CALCIUM 9.3  --    Liver Function Tests: Recent Labs  Lab 05/29/17 0903  AST 21  ALT 29  ALKPHOS 47  BILITOT 0.9  PROT 7.3  ALBUMIN 3.9   No results for input(s): LIPASE, AMYLASE in the last 168 hours. No results for input(s): AMMONIA in the last 168 hours. CBC: Recent Labs  Lab 05/29/17 0903 05/29/17 0917  WBC 8.6  --   NEUTROABS 4.3  --   HGB 13.3 13.6  HCT 40.9 40.0  MCV 91.5  --   PLT 303  --    Cardiac Enzymes: No results for input(s): CKTOTAL, CKMB, CKMBINDEX, TROPONINI  in the last 168 hours. BNP: Invalid input(s): POCBNP CBG: Recent Labs  Lab 05/29/17 1745 05/29/17 2140 05/30/17 0639 05/30/17 1108 05/30/17 1521  GLUCAP 117* 144* 157* 170* 151*   D-Dimer No results for input(s): DDIMER in the last 72 hours. Hgb A1c Recent Labs    05/30/17 0646  HGBA1C 7.8*   Lipid Profile Recent Labs    05/30/17 0646  CHOL 229*  HDL 41  LDLCALC 156*  TRIG 158*  CHOLHDL 5.6   Thyroid function studies No results for input(s): TSH, T4TOTAL, T3FREE, THYROIDAB in the last 72 hours.  Invalid input(s): FREET3 Anemia work up No results for input(s): VITAMINB12, FOLATE, FERRITIN, TIBC, IRON, RETICCTPCT in the last 72 hours. Urinalysis    Component Value Date/Time   COLORURINE YELLOW 05/29/2017 [place] 05/29/2017 1202   LABSPEC 1.010 05/29/2017 1202   PHURINE 5.0 05/29/2017 1202   GLUCOSEU >=500 (A) 05/29/2017 1202    HGBUR NEGATIVE 05/29/2017 1202   BILIRUBINUR NEGATIVE 05/29/2017 1202   KETONESUR NEGATIVE 05/29/2017 1202   PROTEINUR NEGATIVE 05/29/2017 1202   UROBILINOGEN 0.2 09/20/2010 0850   NITRITE NEGATIVE 05/29/2017 1202   LEUKOCYTESUR NEGATIVE 05/29/2017 1202   Sepsis Labs Invalid input(s): PROCALCITONIN,  WBC,  LACTICIDVEN   Time coordinating discharge: 35 minutes  SIGNED:  Marzetta Board, MD  [facility] 05/30/2017, 4:45 PM Pager [phone]  If 7PM-7AM, please contact night-coverage [email or web address] Password [email or web address]

## 2017-05-30 NOTE — Discharge Instructions (Signed)
Follow with Mayra Neer, MD in 5-7 days  Please take Aspirin and Plavix for 3 weeks then take Plavix alone.   Please get a complete blood count and chemistry panel checked by your Primary MD at your next visit, and again as instructed by your Primary MD. Please get your medications reviewed and adjusted by your Primary MD.  Please request your Primary MD to go over all Hospital Tests and Procedure/Radiological results at the follow up, please get all Hospital records sent to your Prim MD by signing hospital release before you go home.  If you had Pneumonia of Lung problems at the Hospital: Please get a 2 view Chest X ray done in 6-8 weeks after hospital discharge or sooner if instructed by your Primary MD.  If you have Congestive Heart Failure: Please call your Cardiologist or Primary MD anytime you have any of the following symptoms:  1) 3 pound weight gain in 24 hours or 5 pounds in 1 week  2) shortness of breath, with or without a dry hacking cough  3) swelling in the hands, feet or stomach  4) if you have to sleep on extra pillows at night in order to breathe  Follow cardiac low salt diet and 1.5 lit/day fluid restriction.  If you have diabetes Accuchecks 4 times/day, Once in AM empty stomach and then before each meal. Log in all results and show them to your primary doctor at your next visit. If any glucose reading is under 80 or above 300 call your primary MD immediately.  If you have Seizure/Convulsions/Epilepsy: Please do not drive, operate heavy machinery, participate in activities at heights or participate in high speed sports until you have seen by Primary MD or a Neurologist and advised to do so again.  If you had Gastrointestinal Bleeding: Please ask your Primary MD to check a complete blood count within one week of discharge or at your next visit. Your endoscopic/colonoscopic biopsies that are pending at the time of discharge, will also need to followed by your Primary  MD.  Get Medicines reviewed and adjusted. Please take all your medications with you for your next visit with your Primary MD  Please request your Primary MD to go over all hospital tests and procedure/radiological results at the follow up, please ask your Primary MD to get all Hospital records sent to his/her office.  If you experience worsening of your admission symptoms, develop shortness of breath, life threatening emergency, suicidal or homicidal thoughts you must seek medical attention immediately by calling 911 or calling your MD immediately  if symptoms less severe.  You must read complete instructions/literature along with all the possible adverse reactions/side effects for all the Medicines you take and that have been prescribed to you. Take any new Medicines after you have completely understood and accpet all the possible adverse reactions/side effects.   Do not drive or operate heavy machinery when taking Pain medications.   Do not take more than prescribed Pain, Sleep and Anxiety Medications  Special Instructions: If you have smoked or chewed Tobacco  in the last 2 yrs please stop smoking, stop any regular Alcohol  and or any Recreational drug use.  Wear Seat belts while driving.  Please note You were cared for by a hospitalist during your hospital stay. If you have any questions about your discharge medications or the care you received while you were in the hospital after you are discharged, you can call the unit and asked to speak with the hospitalist  on call if the hospitalist that took care of you is not available. Once you are discharged, your primary care physician will handle any further medical issues. Please note that NO REFILLS for any discharge medications will be authorized once you are discharged, as it is imperative that you return to your primary care physician (or establish a relationship with a primary care physician if you do not have one) for your aftercare needs so  that they can reassess your need for medications and monitor your lab values.  You can reach the hospitalist office at phone [phone] or fax [phone]   If you do not have a primary care physician, you can call [phone] for a physician referral.  Activity: As tolerated with Full fall precautions use walker/cane & assistance as needed  Diet: heart healthy, diabetic, low salt  Disposition Home

## 2017-05-30 NOTE — Progress Notes (Signed)
Spoke with patient regarding [employer] study.  She states that she has reconsidered and decided to not participate in study due to past experience with rashes with adhesive tapes.  Notified RN.    Thanks,  Adah Perl, RN, BC-ADM Inpatient Diabetes Coordinator Pager [phone] (8a-5p)

## 2017-05-30 NOTE — Progress Notes (Addendum)
STROKE TEAM PROGRESS NOTE  HPI (Dr Leonel Ramsay )Emma Duran is an 62 y.o. female with history of diabetes, hypertension and hyperlipidemia.  Patient states that she awoke this morning was fine until 7 AM.  At that time she noted "fireworks" in her visual field mostly in the left eye.  She also noted that she had decreased sensation in the left face, left arm, and seemed to be not able to work her left leg as she usually does.  For that reason patient is brought to the ED but not by EMS.  Code stroke was called.  Initial CT did not show any abnormalities.  On initial consultation from neurology she did show some decreased sensation on the face, left arm-she stated this was improving, but did not have any decreased sensation in her left leg.  She did show decreased strength in her left arm and left leg.  Blood glucose was 188.  Patient states that she has no history of seizures or TIA/stroke in the past.    Date last known well: Date: 05/29/2017 Time last known well: Time: 07:00 tPA Given: No: Minimal symptoms and resolving symptoms Modified Rankin: Rankin Score=0 NIH stroke scale of 1   INTERVAL HISTORY Her friend is at the bedside.  She is with the wife of Gershon M[employer]'s long time mailroom clerk who died 3 years ago. I have obtained detailed history of present illness from the patient She works full-time in [employer].  She recently came off most of [employer] and transitioned to [employer].  Because of that, multiple medicine changes have occurred leading to less than optimal control of diabetes per patient.  Vitals:   05/30/17 0352 05/30/17 0725 05/30/17 1058 05/30/17 1108  BP: 138/64 129/73 123/84 (!) 146/77  Pulse: 80 68 91 68  Resp: 18 20  16   Temp: 98.4 F (36.9 C) 98.2 F (36.8 C) 97.7 F (36.5 C) 98.4 F (36.9 C)  TempSrc: Oral Oral Oral Oral  SpO2: 96% 97% 100% 98%  Weight:      Height:        CBC:  Recent Labs  Lab 05/29/17 0903  05/29/17 0917  WBC 8.6  --   NEUTROABS 4.3  --   HGB 13.3 13.6  HCT 40.9 40.0  MCV 91.5  --   PLT 303  --     Basic Metabolic Panel:  Recent Labs  Lab 05/29/17 0903 05/29/17 0917  NA 137 137  K 3.9 4.0  CL 102 101  CO2 24  --   GLUCOSE 205* 200*  BUN 10 11  CREATININE 0.76 0.70  CALCIUM 9.3  --    Lipid Panel:     Component Value Date/Time   CHOL 229 (H) 05/30/2017 0646   TRIG 158 (H) 05/30/2017 0646   HDL 41 05/30/2017 0646   CHOLHDL 5.6 05/30/2017 0646   VLDL 32 05/30/2017 0646   LDLCALC 156 (H) 05/30/2017 0646   HgbA1c:  Lab Results  Component Value Date   HGBA1C 7.8 (H) 05/30/2017   Urine Drug Screen:     Component Value Date/Time   LABOPIA NONE DETECTED 05/29/2017 1202   COCAINSCRNUR NONE DETECTED 05/29/2017 1202   LABBENZ NONE DETECTED 05/29/2017 1202   AMPHETMU NONE DETECTED 05/29/2017 1202   THCU NONE DETECTED 05/29/2017 1202   LABBARB NONE DETECTED 05/29/2017 1202    Alcohol Level     Component Value Date/Time   ETH <10 05/29/2017 0943    IMAGING  Ct Angio Head W Or Wo Contrast  Result Date: 05/29/2017 CLINICAL DATA:  Acute stroke right thalamus. Sudden onset of left facial and arm numbness and tingling. EXAM: CT ANGIOGRAPHY HEAD AND NECK TECHNIQUE: Multidetector CT imaging of the head and neck was performed using the standard protocol during bolus administration of intravenous contrast. Multiplanar CT image reconstructions and MIPs were obtained to evaluate the vascular anatomy. Carotid stenosis measurements (when applicable) are obtained utilizing NASCET criteria, using the distal internal carotid diameter as the denominator. CONTRAST:  4mL ISOVUE-370 IOPAMIDOL (ISOVUE-370) INJECTION 76% COMPARISON:  MRI and CT earlier same day. FINDINGS: CTA NECK FINDINGS Aortic arch: Aortic atherosclerosis. No aneurysm or dissection. Branching pattern of the brachiocephalic vessels from the arch is normal. 30-50% stenosis of the left subclavian artery origin.  Right carotid system: Common carotid artery is widely patent to the bifurcation. At the bifurcation, there is mild atherosclerotic plaque but no stenosis or irregularity. Cervical ICA is widely patent. Left carotid system: Common carotid artery is widely patent to the bifurcation. Minimal atherosclerotic plaque at the carotid bifurcation and ICA bulb but no stenosis. Vertebral arteries: Both vertebral arteries are patent. 30% stenosis of both vertebral artery origins. Both vertebral arteries are widely patent beyond that to the foramen magnum. Skeleton: Ordinary cervical spondylosis. Other neck: No soft tissue mass or lymphadenopathy. Upper chest: Negative Review of the MIP images confirms the above findings CTA HEAD FINDINGS Anterior circulation: Both internal carotid arteries are patent through the skull base and siphon regions. There is atherosclerotic calcification in the carotid siphon regions but no stenosis greater than 30%. Supraclinoid internal carotid arteries are widely patent. The anterior and middle cerebral vessels are patent. There is a 50% stenosis of the distal M1 segment on the right. No stenosis on the left. Both posterior cerebral arteries take fetal origin from the anterior circulation. Posterior circulation: Both vertebral arteries show extensive calcified plaque at the foramen magnum level with severe stenosis but do show flow distal to the stenoses, both vertebral arteries reaching the basilar. Distal vertebral stenoses estimated at 70% or greater on each side. Basilar artery is a small vessel which primarily supplies the superior cerebellar arteries. There appears to be a severe stenosis in the midportion of the basilar. I think there are minor communications between the fetal origin PCAs and the basilar tip, probably giving some collateral flow to the distal basilar. Venous sinuses: Patent and normal. Anatomic variants: None other significant. Delayed phase: No abnormal enhancement. Review  of the MIP images confirms the above findings IMPRESSION: Aortic atherosclerosis. 30-50% stenosis of the left subclavian artery origin. Mild atherosclerotic change at the carotid bifurcations without stenosis. Atherosclerotic change in the carotid siphon regions but without stenosis greater than 30%. 50% stenosis of the distal right M1 segment. 30% stenosis at both vertebral artery origins. 70% or greater stenosis at the V3 V4 junction of each vertebral artery. Severe stenosis of the mid basilar, but with distal flow. Both posterior cerebral arteries receive most of there supply from the anterior circulation. There are probably small communications between the posterior cerebral arteries and the basilar tip. Electronically Signed   By: Nelson Chimes M.D.   On: 05/29/2017 15:56   Dg Chest 2 View  Result Date: 05/29/2017 CLINICAL DATA:  Left-sided numbness and tingling of the face and arm since this morning. EXAM: CHEST - 2 VIEW COMPARISON:  PA and lateral chest x-ray dated May 23, 2012 FINDINGS: The lungs are adequately inflated and clear. The heart and pulmonary vascularity  are normal. The mediastinum is normal in width. The trachea is midline. The bony thorax exhibits no acute abnormality. There is calcification in the wall of the aortic arch. IMPRESSION: There is no active cardiopulmonary disease. Thoracic aortic atherosclerosis. Electronically Signed   By: David  Martinique M.D.   On: 05/29/2017 15:18   Ct Angio Neck W Or Wo Contrast  Result Date: 05/29/2017 CLINICAL DATA:  Acute stroke right thalamus. Sudden onset of left facial and arm numbness and tingling. EXAM: CT ANGIOGRAPHY HEAD AND NECK TECHNIQUE: Multidetector CT imaging of the head and neck was performed using the standard protocol during bolus administration of intravenous contrast. Multiplanar CT image reconstructions and MIPs were obtained to evaluate the vascular anatomy. Carotid stenosis measurements (when applicable) are obtained utilizing  NASCET criteria, using the distal internal carotid diameter as the denominator. CONTRAST:  45mL ISOVUE-370 IOPAMIDOL (ISOVUE-370) INJECTION 76% COMPARISON:  MRI and CT earlier same day. FINDINGS: CTA NECK FINDINGS Aortic arch: Aortic atherosclerosis. No aneurysm or dissection. Branching pattern of the brachiocephalic vessels from the arch is normal. 30-50% stenosis of the left subclavian artery origin. Right carotid system: Common carotid artery is widely patent to the bifurcation. At the bifurcation, there is mild atherosclerotic plaque but no stenosis or irregularity. Cervical ICA is widely patent. Left carotid system: Common carotid artery is widely patent to the bifurcation. Minimal atherosclerotic plaque at the carotid bifurcation and ICA bulb but no stenosis. Vertebral arteries: Both vertebral arteries are patent. 30% stenosis of both vertebral artery origins. Both vertebral arteries are widely patent beyond that to the foramen magnum. Skeleton: Ordinary cervical spondylosis. Other neck: No soft tissue mass or lymphadenopathy. Upper chest: Negative Review of the MIP images confirms the above findings CTA HEAD FINDINGS Anterior circulation: Both internal carotid arteries are patent through the skull base and siphon regions. There is atherosclerotic calcification in the carotid siphon regions but no stenosis greater than 30%. Supraclinoid internal carotid arteries are widely patent. The anterior and middle cerebral vessels are patent. There is a 50% stenosis of the distal M1 segment on the right. No stenosis on the left. Both posterior cerebral arteries take fetal origin from the anterior circulation. Posterior circulation: Both vertebral arteries show extensive calcified plaque at the foramen magnum level with severe stenosis but do show flow distal to the stenoses, both vertebral arteries reaching the basilar. Distal vertebral stenoses estimated at 70% or greater on each side. Basilar artery is a small vessel  which primarily supplies the superior cerebellar arteries. There appears to be a severe stenosis in the midportion of the basilar. I think there are minor communications between the fetal origin PCAs and the basilar tip, probably giving some collateral flow to the distal basilar. Venous sinuses: Patent and normal. Anatomic variants: None other significant. Delayed phase: No abnormal enhancement. Review of the MIP images confirms the above findings IMPRESSION: Aortic atherosclerosis. 30-50% stenosis of the left subclavian artery origin. Mild atherosclerotic change at the carotid bifurcations without stenosis. Atherosclerotic change in the carotid siphon regions but without stenosis greater than 30%. 50% stenosis of the distal right M1 segment. 30% stenosis at both vertebral artery origins. 70% or greater stenosis at the V3 V4 junction of each vertebral artery. Severe stenosis of the mid basilar, but with distal flow. Both posterior cerebral arteries receive most of there supply from the anterior circulation. There are probably small communications between the posterior cerebral arteries and the basilar tip. Electronically Signed   By: Nelson Chimes M.D.   On: 05/29/2017  15:56   Mr Brain Wo Contrast (neuro Protocol)  Result Date: 05/29/2017 CLINICAL DATA:  Acute onset of left-sided facial numbness. Clumsiness of the left upper and lower extremity. EXAM: MRI HEAD WITHOUT CONTRAST TECHNIQUE: Multiplanar, multiecho pulse sequences of the brain and surrounding structures were obtained without intravenous contrast. COMPARISON:  CT head 05/29/2017.  CT head 05/07/2012. FINDINGS: Brain: The diffusion-weighted images demonstrate acute/subacute nonhemorrhagic infarct involving the posterior limb of the right internal capsule and in the lateral thalamus. Subtle T2 signal changes are associated. Dilated perivascular space is noted just below this level. No other significant white matter disease or other infarcts are present.  Mild generalized atrophy is noted. Ventricles are of normal size. No significant extra-axial fluid collection is present. The internal auditory canals are within normal limits bilaterally. The brainstem and cerebellum are normal. Vascular: Flow is present in the major intracranial arteries. Skull and upper cervical spine: Skull base is within normal limits. The craniocervical junction is normal. The upper cervical spine is within normal limits. A benign scalp lipoma is noted anteriorly on the left. Sinuses/Orbits: Small polyps or mucous retention cysts are noted inferiorly in the maxillary sinuses bilaterally. There is opacification of anterior right ethmoid air cells. The frontal and sphenoid sinuses are clear. The mastoid air cells are clear. Globes and orbits are within normal limits. IMPRESSION: 1. Acute nonhemorrhagic infarct of the lateral right thalamus and posterior limb right internal capsule measures up to 8 mm. 2. MRI otherwise normal for age. These results were called by telephone at the time of interpretation on 05/29/2017 at 1:46 pm to Dr. Roland Rack, who verbally acknowledged these results. Electronically Signed   By: San Morelle M.D.   On: 05/29/2017 13:46   Ct Head Code Stroke Wo Contrast  Result Date: 05/29/2017 CLINICAL DATA:  Code stroke. Left-sided numbness. Last seen normal 0700 hours EXAM: CT HEAD WITHOUT CONTRAST TECHNIQUE: Contiguous axial images were obtained from the base of the skull through the vertex without intravenous contrast. COMPARISON:  12/07/2012 . FINDINGS: Brain: Mild age related atrophy as seen previously. No sign of acute infarction, mass lesion, hemorrhage, hydrocephalus or extra-axial collection. Vascular: There is atherosclerotic calcification of the major vessels at the base of the brain. Skull: Negative Sinuses/Orbits: Clear/normal Other: None ASPECTS ([place] Stroke Program Early CT Score) - Ganglionic level infarction (caudate, lentiform nuclei,  internal capsule, insula, M1-M3 cortex): 7 - Supraganglionic infarction (M4-M6 cortex): 3 Total score (0-10 with 10 being normal): 10 IMPRESSION: 1. No acute finding. Mild age related atrophy. Extensive atherosclerotic calcification of the major vessels at the base of the brain. 2. ASPECTS is 10. 3. These results were communicated to Dr. Leonel Ramsay at [facility] 5/28/2019by text page via the [facility] messaging system. Electronically Signed   By: Nelson Chimes M.D.   On: 05/29/2017 09:23    PHYSICAL EXAM Pleasant middle-aged lady not in distress HEENT-  Normocephalic, no lesions, without obvious abnormality.  Normal external eye and conjunctiva.   Cardiovascular- S1-S2 audible, pulses palpable throughout   Lungs-no rhonchi or wheezing noted, no excessive working breathing.  Saturations within normal limits Abdomen- All 4 quadrants palpated and nontender Extremities- Warm, dry and intact Musculoskeletal-no joint tenderness, deformity or swelling Skin-warm and dry, no hyperpigmentation, vitiligo, or suspicious lesions  Neurological Examination Mental Status: Alert, oriented, thought content appropriate.  Speech fluent without evidence of aphasia.  Able to follow 3 step commands without difficulty. Cranial Nerves: II:  Visual Eberlein grossly normal,  III,IV, VI: ptosis not present, extra-ocular motions  intact bilaterally, pupils equal, round, reactive to light and accommodation V,VII: smile symmetric, facial light touch sensation intact  VIII: hearing normal bilaterally IX,X: uvula rises symmetrically XI: bilateral shoulder shrug XII: midline tongue extension Motor: Right :  Upper extremity   5/5                                      Left:     Upper extremity   5/5             Lower extremity   5/5                                                  Lower extremity   5/5 Tone and bulk:normal tone throughout; no atrophy noted Sensory:  Intact bilaterally Plantars: Right: downgoing                                 Left: downgoing Cerebellar: normal finger-to-nose, and normal heel-to-shin test Gait: Not tested   ASSESSMENT/PLAN [name] is a 62 y.o. female with history of hypertension, hyperlipidemia and diabetes presenting with left high bright spots, Ding decreased sensation in her left face and arm.   Stroke:   Right thalamic/internal capsule and possible midline pontine lacunar infarcts secondary to small vessel disease    Resultant neurologic symptoms resolved  Code Stroke CT head No acute stroke.  Extensive atherosclerosis major vessels at base of brain.  ASPECTS 10.     MRI acute right lateral thalamic and posterior limb internal capsule infarct  CTA head & neck left subclavian artery 30 to 50% stenosis.  Right M1 50% stenosis.  Multiple atherosclerosis: Carotid siphons less than 30%, B VA 30% at origin, B VA 70% V3 V4 junction, severe mid BA.  2D Echo  pending   EEG normal  LDL 156  HgbA1c 7.8  Patient ambulatory, no VTE prophylaxis Diet Order           Diet heart healthy/carb modified Room service appropriate? Yes; Fluid consistency: Thin  Diet effective now           aspirin 81 mg daily prior to admission, now on aspirin 325 mg daily. Given  mild stroke, will place on aspirin 81 mg and plavix 75 mg daily x 3 weeks, then plavix alone (as was on aspirin at time of stroke). Orders adjusted.   Therapy recommendations: No therapy needs  Disposition: Return home  Okay for discharge from stroke standpoint once echo resulted.  Follow up stroke clinic in 4 weeks.  Orders placed.  Hypertension  Stable . Permissive hypertension (OK if < 220/120) but gradually normalize in 5-7 days . Long-term BP goal normotensive  Hyperlipidemia  Home meds: Lipitor 40 mg but was not taking routinely every day, resumed in hospital  LDL 156, goal < 70  Continue statin at discharge  Diabetes type II  HgbA1c 7.8, goal < 7.0  Uncontrolled  Diabetes nurse  following  Other Stroke Risk Factors  Overweight  Body mass index is 29.78 kg/m., recommend weight loss, diet and exercise as appropriate   Other Active Problems  GERD on PPI  Hospital day # 1  Burnetta Sabin, MSN, APRN, ANVP-BC, AGPCNP-BC  Advanced Practice Stroke Nurse [place] for Schedule & Pager information 05/30/2017 2:51 PM  I have personally examined this patient, reviewed notes, independently viewed imaging studies, participated in medical decision making and plan of care.ROS completed by me personally and pertinent positives fully documented  I have made any additions or clarifications directly to the above note. Agree with note above. She presented with left face and arm paresthesias and transient left visual field positive visual phenomenon secondary to right thalamic lacunar infarct from small vessel disease. Recommend continue ongoing stroke workup. Dual antiplatelet therapy for 3 weeks followed by Plavix alone. Discussed with patient and Dr. Gloris Ham and Dr Leonel Ramsay.. Greater than 50% time during this 35 minute visit was spent on counseling and coordination of care about her lacunar infarct and answered questions  Antony Contras, MD Medical Director [place] Pager: [phone] 05/30/2017 3:42 PM  To contact Stroke Continuity provider, please refer to [email or web address] After hours, contact General Neurology

## 2017-05-30 NOTE — Evaluation (Signed)
Occupational Therapy Evaluation Patient Details Name: Emma Duran MRN: [id number] DOB: 28-Dec-1955 Today's Date: 05/30/2017    History of Present Illness 62 y.o. female with history of diabetes, hypertension and hyperlipidemia admitted with decreased sensation left arm/face.  MRI: Acute nonhemorrhagic infarct of the lateral right thalamus and posterior limb right internal capsule measures up to 8 mm. Pt works full time as a Optometrist in [employer]   Clinical Impression   Pt at independent baseline level of function, moving well with no AD or LOB. All education completed and no further acute OT indicated at this time    Follow Up Recommendations  No OT follow up    Equipment Recommendations  None recommended by OT    Recommendations for Other Services       Precautions / Restrictions Precautions Precautions: None Restrictions Weight Bearing Restrictions: No      Mobility Bed Mobility Overal bed mobility: Independent             General bed mobility comments: no difficulty  Transfers Overall transfer level: Independent Equipment used: None                  Balance Overall balance assessment: Independent                               Standardized Balance Assessment Standardized Balance Assessment : Dynamic Gait Index   Dynamic Gait Index Level Surface: Normal Change in Gait Speed: Normal Gait with Horizontal Head Turns: Normal Gait with Vertical Head Turns: Normal Gait and Pivot Turn: Normal Step Over Obstacle: Mild Impairment Step Around Obstacles: Normal Steps: Mild Impairment Total Score: 22     ADL either performed or assessed with clinical judgement   ADL Overall ADL's : At baseline;Independent                                             Vision Baseline Vision/History: Wears glasses Wears Glasses: At all times Patient Visual Report: No change from baseline       Perception     Praxis      Pertinent Vitals/Pain Pain Assessment: No/denies pain     Hand Dominance Right   Extremity/Trunk Assessment Upper Extremity Assessment Upper Extremity Assessment: Overall WFL for tasks assessed   Lower Extremity Assessment Lower Extremity Assessment: Defer to PT evaluation   Cervical / Trunk Assessment Cervical / Trunk Assessment: Normal   Communication Communication Communication: No difficulties   Cognition Arousal/Alertness: Awake/alert Behavior During Therapy: WFL for tasks assessed/performed Overall Cognitive Status: Within Functional Limits for tasks assessed                                     General Comments       Exercises     Shoulder Instructions      Home Living Family/patient expects to be discharged to:: Private residence Living Arrangements: Alone   Type of Home: House Home Access: Stairs to enter Technical brewer of Steps: 1   Home Layout: Multi-level Alternate Level Stairs-Number of Steps: 7 Alternate Level Stairs-Rails: Left Bathroom Shower/Tub: Teacher, early years/pre: Standard     Home Equipment: Environmental consultant - 2 wheels;Cane - single point;Crutches      Lives With:  Alone    Prior Functioning/Environment Level of Independence: Independent                 OT Problem List: Impaired balance (sitting and/or standing)      OT Treatment/Interventions:      OT Goals(Current goals can be found in the care plan section) Acute Rehab OT Goals Patient Stated Goal: go home OT Goal Formulation: With patient  OT Frequency:     Barriers to D/C:    no barriers       Co-evaluation              AM-PAC PT "6 Clicks" Daily Activity     Outcome Measure Help from another person eating meals?: None Help from another person taking care of personal grooming?: None Help from another person toileting, which includes using toliet, bedpan, or urinal?: None Help from another person bathing (including washing,  rinsing, drying)?: None Help from another person to put on and taking off regular upper body clothing?: None Help from another person to put on and taking off regular lower body clothing?: None 6 Click Score: 24   End of Session    Activity Tolerance: Patient tolerated treatment well Patient left: in bed(sitting EOB)                   Time: 9169-4503 OT Time Calculation (min): 17 min Charges:  OT General Charges $OT Visit: 1 Visit OT Evaluation $OT Eval Low Complexity: 1 Low G-Codes: OT G-codes **NOT FOR INPATIENT CLASS** Functional Assessment Tool Used: AM-PAC 6 Clicks Daily Activity     Britt Bottom 05/30/2017, 12:07 PM

## 2017-05-30 NOTE — Evaluation (Signed)
Speech Language Pathology Evaluation Patient Details Name: Emma Duran MRN: [id number] DOB: 1955/06/07 Today's Date: 05/30/2017 Time: 0539-7673 SLP Time Calculation (min) (ACUTE ONLY): 11 min  Problem List:  Patient Active Problem List   Diagnosis Date Noted  . Stroke ([place]) 05/29/2017  . Dysphagia, pharyngoesophageal phase 11/08/2012  . Esophageal stricture 11/08/2012  . GERD (gastroesophageal reflux disease) 11/08/2012  . Unspecified gastritis and gastroduodenitis without mention of hemorrhage 11/08/2012  . Diabetes ([place]) 05/28/2010  . Hyperlipidemia 05/28/2010  . Essential hypertension 05/28/2010   Past Medical History:  Past Medical History:  Diagnosis Date  . Abnormal breast exam    chip in right breast as marker for abnormality   . Arthritis    knee, hands  . Diabetes mellitus    type 2  . External hemorrhoids   . GERD (gastroesophageal reflux disease)   . Heart murmur    never has caused any problems  . History of colon polyps   . History of kidney stones    surgery done  . Hyperlipidemia   . Hypertension   . Internal hemorrhoids    Past Surgical History:  Past Surgical History:  Procedure Laterality Date  . ABDOMINAL HYSTERECTOMY    . BREAST LUMPECTOMY  09/28/10   right breast   . COLONOSCOPY    . ESOPHAGOGASTRODUODENOSCOPY    . [place]  2010  . KNEE ARTHROSCOPY WITH MEDIAL MENISECTOMY Right 09/30/2013   Procedure: Right knee arthroscopy partial meniscectomy, debridement;  Surgeon: Nita Sells, MD;  Location: [place];  Service: Orthopedics;  Laterality: Right;  Right knee arthroscopy partial menisectomy, debridement  . WISDOM TOOTH EXTRACTION     HPI:  62 y.o. female with history of diabetes, hypertension and hyperlipidemia admitted with decreased sensation left arm/face.  MRI: Acute nonhemorrhagic infarct of the lateral right thalamus and posterior limb right internal capsule measures up to 8 mm. 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   Assessment / Plan / Recommendation Clinical Impression  Pt is a good historian, presents with normal cognitive-communicative function; sensory deficits have resolved.  Speech clear and fluent.  Good memory.  No SLP needs identified - our services will sign off.     SLP Assessment  SLP Recommendation/Assessment: Patient does not need any further Speech Lanaguage Pathology Services    Follow Up Recommendations       Frequency and Duration           SLP Evaluation Cognition  Overall Cognitive Status: Within Functional Limits for tasks assessed Arousal/Alertness: Awake/alert Orientation Level: Oriented X4 Attention: Alternating Alternating Attention: Appears intact Memory: Appears intact Awareness: Appears intact Executive Function: Reasoning Reasoning: Appears intact       Comprehension  Auditory Comprehension Overall Auditory Comprehension: Appears within functional limits for tasks assessed Visual Recognition/Discrimination Discrimination: Within Function Limits Reading Comprehension Reading Status: Within funtional limits    Expression Expression Primary Mode of Expression: Verbal Verbal Expression Overall Verbal Expression: Appears within functional limits for tasks assessed   Oral / Motor  Motor Speech Overall Motor Speech: Appears within functional limits for tasks assessed   GO                    Juan Quam Laurice 05/30/2017, 11:12 AM

## 2017-05-31 MED FILL — ATORVASTATIN 40 MG TABLET: 40 | 30 days supply | Qty: 30 | Fill #3

## 2017-05-31 MED FILL — CLOPIDOGREL 75 MG TABLET: 75 | 30 days supply | Qty: 30 | Fill #0

## 2017-05-31 MED FILL — NAPROXEN 500 MG TABLET: 500 | 30 days supply | Qty: 60 | Fill #2

## 2017-05-31 MED FILL — metFORMIN HCL 1000 MG TABS: 1000 | 90 days supply | Qty: 180 | Fill #1

## 2017-06-01 MED FILL — Perflutren Lipid Microsphere IV Susp 6.52 MG/ML: INTRAVENOUS | Qty: 2 | Status: AC

## 2017-06-04 DIAGNOSIS — E1165 Type 2 diabetes mellitus with hyperglycemia: Secondary | ICD-10-CM | POA: Diagnosis not present

## 2017-06-04 DIAGNOSIS — E7800 Pure hypercholesterolemia, unspecified: Secondary | ICD-10-CM | POA: Diagnosis not present

## 2017-06-04 DIAGNOSIS — I10 Essential (primary) hypertension: Secondary | ICD-10-CM | POA: Diagnosis not present

## 2017-06-04 DIAGNOSIS — I639 Cerebral infarction, unspecified: Secondary | ICD-10-CM | POA: Diagnosis not present

## 2017-06-20 MED FILL — BYDUREON BCise 2 MG/0.85ML: 2 | 28 days supply | Qty: 3 | Fill #1

## 2017-06-28 MED FILL — ATORVASTATIN 40 MG TABLET: 40 | 30 days supply | Qty: 30 | Fill #0

## 2017-06-28 MED FILL — CLOPIDOGREL 75 MG TABLET: 75 | 30 days supply | Qty: 30 | Fill #1

## 2017-07-02 MED FILL — UNIFINE PENTIPS 32GX5/32: 32G X 4 MM | 90 days supply | Qty: 100 | Fill #2

## 2017-07-02 MED FILL — UNIFINE PENTIPS 32GX5/32": 32G X 4 MM | 90 days supply | Qty: 100 | Fill #2

## 2017-07-02 MED FILL — TRESIBA FLEXTOUCH 100 UNITS: 100 | 75 days supply | Qty: 15 | Fill #0

## 2017-07-02 NOTE — Progress Notes (Signed)
[facility] [address] [place]. [place] [address] [phone]       OFFICE FOLLOW UP NOTE  Emma Duran Date of Birth:  [birth year] Medical Record Number:  [id number]   Reason for Referral:  hospital stroke follow up  CHIEF COMPLAINT:  Chief Complaint  Patient presents with  . Follow-up    Stroke hospital follow up room, pt is alone     HPI: Emma Duran is being seen today for initial visit in the office for right lateral thalamic and posterior limb internal capsule infarct on 05/29/2017. History obtained from patient and chart review. Reviewed all radiology images and labs personally.  [name] is a 62 y.o. female with history of hypertension, hyperlipidemia and diabetes  who presented with left high bright spots, and decreased sensation in her left face and arm.  CT head reviewed and was negative for acute stroke.  MRI reviewed and showed acute right lateral thalamic and posterior limb internal capsule infarct.  CTA head and neck showed left subclavian artery 30 to 50% stenosis, right and 150s by stenosis, multiple arthrosclerosis involving the carotid siphons less than 30%, B VA 30% of the origin, B VA 70% V3 V4 junction and superior mid BA.  2D echo showed an EF of 60 to 65%.  EEG negative for seizure activity.  LDL 156 and his patient was previously on Lipitor 40 mg a day but not compliant this was recommended to continue.  A1c 7.8 and recommended tight glycemic control with close PCP follow-up.  Patient was on aspirin 81 mg PTA and recommended DAPT for 3 weeks and then Plavix alone.  Patient was discharged home in stable condition without needed therapies.  Patient is being seen today for hospital follow-up and overall is doing well.  She states that she has no residual symptoms from her stroke and has returned to all previous activities.  She continues to take only Plavix (completed 3-week DAPT) without side effects of bleeding or bruising.   Continues to take Lipitor without side effects myalgias.  Blood pressure satisfactory at 143/83.  Glucose levels have been under control per patient with them being on average 130.  She does have complaints of increased tingling and numbness in her lower extremities with left greater than right.  Denies nerve pain.  Also has complaint of area on right side of neck where she states it can feel tight and obtain a tension headache but this is intermittent and increases with stress.  States her current office does not have air conditioning at this time and as it has been overly hot in her office, she feels as though she has been dehydrated and having an increase in these neck pains.  Denies previous neck pains or trauma to the neck area.  Denies new or worsening stroke/TIA symptoms.   ROS:   14 system review of systems performed and negative with exception of feeling hot  PMH:  Past Medical History:  Diagnosis Date  . Abnormal breast exam    chip in right breast as marker for abnormality   . Arthritis    knee, hands  . Diabetes mellitus    type 2  . External hemorrhoids   . GERD (gastroesophageal reflux disease)   . Heart murmur    never has caused any problems  . History of colon polyps   . History of kidney stones    surgery done  . Hyperlipidemia   . Hypertension   .  Internal hemorrhoids   . Stroke [facility])     PSH:  Past Surgical History:  Procedure Laterality Date  . ABDOMINAL HYSTERECTOMY    . BREAST LUMPECTOMY  09/28/10   right breast   . COLONOSCOPY    . ESOPHAGOGASTRODUODENOSCOPY    . [place]  2010  . KNEE ARTHROSCOPY WITH MEDIAL MENISECTOMY Right 09/30/2013   Procedure: Right knee arthroscopy partial meniscectomy, debridement;  Surgeon: Nita Sells, MD;  Location: [place];  Service: Orthopedics;  Laterality: Right;  Right knee arthroscopy partial menisectomy, debridement  . WISDOM TOOTH EXTRACTION      Social History:  Social  History   Socioeconomic History  . Marital status: Married    Spouse name: Not on file  . Number of children: 1  . Years of education: Not on file  . Highest education level: Not on file  Occupational History  . Occupation: OFFICE MANAGER    Employer: [employer]  Social Needs  . Financial resource strain: Not on file  . Food insecurity:    Worry: Not on file    Inability: Not on file  . Transportation needs:    Medical: Not on file    Non-medical: Not on file  Tobacco Use  . Smoking status: Never Smoker  . Smokeless tobacco: Never Used  Substance and Sexual Activity  . Alcohol use: No    Alcohol/week: 0.0 oz  . Drug use: No  . Sexual activity: Yes    Birth control/protection: Post-menopausal, None    Comment: hysterectomy  Lifestyle  . Physical activity:    Days per week: Not on file    Minutes per session: Not on file  . Stress: Not on file  Relationships  . Social connections:    Talks on phone: Not on file    Gets together: Not on file    Attends religious service: Not on file    Active member of club or organization: Not on file    Attends meetings of clubs or organizations: Not on file    Relationship status: Not on file  . Intimate partner violence:    Fear of current or ex partner: Not on file    Emotionally abused: Not on file    Physically abused: Not on file    Forced sexual activity: Not on file  Other Topics Concern  . Not on file  Social History Narrative  . Not on file    Family History:  Family History  Problem Relation Age of Onset  . Diabetes Mother   . Lung cancer Father   . Cancer Father 58       lung cancer   . Cancer Paternal Grandfather   . Lung cancer Paternal Grandmother   . Hypertension Unknown   . Colon cancer Neg Hx   . Stomach cancer Neg Hx   . Esophageal cancer Neg Hx   . Colon polyps Neg Hx   . Rectal cancer Neg Hx     Medications:   Current Outpatient Medications on File Prior to Visit  Medication Sig  Dispense Refill  . atorvastatin (LIPITOR) 40 MG tablet Take 40 mg by mouth daily.    Marland Kitchen BYDUREON BCISE 2 MG/0.85ML AUIJ Inject 2 mg as directed once a week.  3  . clopidogrel (PLAVIX) 75 MG tablet Take 1 tablet (75 mg total) by mouth daily. 30 tablet 2  . lisinopril-hydrochlorothiazide (PRINZIDE,ZESTORETIC) 10-12.5 MG per tablet Take 1 tablet by mouth daily.      Marland Kitchen  Magnesium 250 MG TABS Take by mouth. 1 time daily    . metFORMIN (GLUCOPHAGE) 1000 MG tablet Take 1,000 mg by mouth 2 (two) times daily with a meal.  1  . naproxen (NAPROSYN) 500 MG tablet Take 500 mg by mouth daily.     . Potassium Bicarbonate 99 MG CAPS Take 99 mg by mouth daily.    . TRESIBA FLEXTOUCH 100 UNIT/ML SOPN FlexTouch Pen Inject 20 Units as directed daily.  0  . VITAMIN D, ERGOCALCIFEROL, PO Take 2,000 Int'l Units by mouth daily.      No current facility-administered medications on file prior to visit.     Allergies:   Allergies  Allergen Reactions  . Omeprazole     Full body hives and itching   . Morphine   . Percocet [Oxycodone-Acetaminophen] Other (See Comments)    Patient states "Pain med doesn't work"  . Tramadol   . Morphine And Related Anxiety     Physical Exam  Vitals:   07/03/17 1314  BP: (!) 143/83  Pulse: 75  Weight: 180 lb 6.4 oz (81.8 kg)  Height: 5\' 6"  (1.676 m)   Body mass index is 29.12 kg/m. No exam data present  General: well developed, well nourished, pleasant middle-aged Caucasian female, seated, in no evident distress Head: head normocephalic and atraumatic.   Neck: supple with no carotid or supraclavicular bruits Cardiovascular: regular rate and rhythm, no murmurs Musculoskeletal: no deformity; muscle tension noted on right posterior neck Skin:  no rash/petichiae Vascular:  Normal pulses all extremities  Neurologic Exam Mental Status: Awake and fully alert. Oriented to place and time. Recent and remote memory intact. Attention span, concentration and fund of knowledge  appropriate. Mood and affect appropriate.  Cranial Nerves: Fundoscopic exam reveals sharp disc margins. Pupils equal, briskly reactive to light. Extraocular movements full without nystagmus. Visual Drum full to confrontation. Hearing intact. Facial sensation intact. Face, tongue, palate moves normally and symmetrically.  Motor: Normal bulk and tone. Normal strength in all tested extremity muscles. Sensory.:  Decreased sensation noted bilateral lower extremities distally with both vibratory and pinprick sensation Coordination: Rapid alternating movements normal in all extremities. Finger-to-nose and heel-to-shin performed accurately bilaterally. Gait and Station: Arises from chair without difficulty. Stance is normal. Gait demonstrates normal stride length and balance . Able to heel, toe and tandem walk without difficulty.  Reflexes: 1+ and symmetric. Toes downgoing.    NIHSS  0 Modified Rankin  1    Diagnostic Data (Labs, Imaging, Testing)  CT HEAD WO CONTRAST 05/29/2017 IMPRESSION: 1. No acute finding. Mild age related atrophy. Extensive atherosclerotic calcification of the major vessels at the base of the brain. 2. ASPECTS is 10.  MR BRAIN WO CONTRAST 05/29/2017 IMPRESSION: 1. Acute nonhemorrhagic infarct of the lateral right thalamus and posterior limb right internal capsule measures up to 8 mm. 2. MRI otherwise normal for age.  CT ANGIO HEAD/NECK W OR WO CONTRAST 05/29/2017 IMPRESSION: Aortic atherosclerosis. 30-50% stenosis of the left subclavian artery origin. Mild atherosclerotic change at the carotid bifurcations without stenosis. Atherosclerotic change in the carotid siphon regions but without stenosis greater than 30%. 50% stenosis of the distal right M1 segment. 30% stenosis at both vertebral artery origins. 70% or greater stenosis at the V3 V4 junction of each vertebral artery. Severe stenosis of the mid basilar, but with distal flow. Both posterior cerebral  arteries receive most of there supply from the anterior circulation. There are probably small communications between the posterior cerebral arteries and the basilar  tip.    ASSESSMENT: Emma Duran is a 62 y.o. year old female here with right lateral thalamus and posterior limb internal capsule on 05/29/2017 secondary to small vessel disease. Vascular risk factors include HLD, HTN and DM.     PLAN: -Continue clopidogrel 75 mg daily  and Lipitor for secondary stroke prevention -F/u with PCP regarding your HLD, HTN and DM management -continue to monitor BP at home -Provided patient with loss of neck exercises and relaxation techniques to help with neck pain and advised that if this continues to follow-up with PCP -also recommended use of Tylenol as needed for pain along with heat/ice -As patient seems to have possible diabetic neuropathy, it was recommended to avoid being barefoot and to check feet frequently for open sores or wounds -will not start medication at this time due to lack of neuropathy pain the patient was advised to call us if this occurs -Maintain strict control of hypertension with blood pressure goal below 130/90, diabetes with hemoglobin A1c goal below 6.5% and cholesterol with LDL cholesterol (bad cholesterol) goal below 70 mg/dL. I also advised the patient to eat a healthy diet with plenty of whole grains, cereals, fruits and vegetables, exercise regularly and maintain ideal body weight.  Follow up in 6 months or call earlier if needed   Greater than 50% of time during this 25 minute visit was spent on counseling,explanation of diagnosis of right lateral thalamus and posterior limb internal capsule, reviewing risk factor management of HLD, HTN and DM, planning of further management, discussion with patient and family and coordination of care    Venancio Poisson, [facility]  [facility] [address] [place], [place] [address]  Phone  [phone] Fax [phone]

## 2017-07-03 ENCOUNTER — Ambulatory Visit (INDEPENDENT_AMBULATORY_CARE_PROVIDER_SITE_OTHER): Payer: BLUE CROSS/BLUE SHIELD | Admitting: Adult Health

## 2017-07-03 ENCOUNTER — Encounter: Payer: Self-pay | Admitting: Adult Health

## 2017-07-03 VITALS — BP 143/83 | HR 75 | Ht 66.0 in | Wt 180.4 lb

## 2017-07-03 DIAGNOSIS — E785 Hyperlipidemia, unspecified: Secondary | ICD-10-CM

## 2017-07-03 DIAGNOSIS — Z794 Long term (current) use of insulin: Secondary | ICD-10-CM | POA: Diagnosis not present

## 2017-07-03 DIAGNOSIS — I6300 Cerebral infarction due to thrombosis of unspecified precerebral artery: Secondary | ICD-10-CM

## 2017-07-03 DIAGNOSIS — I10 Essential (primary) hypertension: Secondary | ICD-10-CM | POA: Diagnosis not present

## 2017-07-03 DIAGNOSIS — E1142 Type 2 diabetes mellitus with diabetic polyneuropathy: Secondary | ICD-10-CM

## 2017-07-03 MED ORDER — CLOPIDOGREL BISULFATE 75 MG PO TABS: 75.0000 mg | ORAL_TABLET | Freq: Every day | ORAL | 3 refills | Status: DC

## 2017-07-03 NOTE — Patient Instructions (Signed)
Continue clopidogrel 75 mg daily  and lipitor  for secondary stroke prevention  Continue to follow up with PCP regarding cholesterol, blood pressure and diabetes management   Continue to monitor blood pressure at home  Provided is a list of neck exercises that you can do to help relieve neck pain and tension in neck - consider using tylenol (avoid advil, naproxen or aleve) and heat for neck pain  Be careful of your feet as you have decreased sensation most likely related to diabetes - please do not walk around barefoot and check feet consistently for sores - let us know if you start to develop nerve pain (burning, electricity, shooting sensation)  Maintain strict control of hypertension with blood pressure goal below 130/90, diabetes with hemoglobin A1c goal below 6.5% and cholesterol with LDL cholesterol (bad cholesterol) goal below 70 mg/dL. I also advised the patient to eat a healthy diet with plenty of whole grains, cereals, fruits and vegetables, exercise regularly and maintain ideal body weight.  Followup in the future with me in 6 months or call earlier if needed       Thank you for coming to see [place] at [facility]. I hope we have been able to provide you high quality care today.  You may receive a patient satisfaction survey over the next few weeks. We would appreciate your feedback and comments so that we may continue to improve ourselves and the health of our patients.     Neck Exercises Neck exercises can be important for many reasons:  They can help you to improve and maintain flexibility in your neck. This can be especially important as you age.  They can help to make your neck stronger. This can make movement easier.  They can reduce or prevent neck pain.  They may help your upper back.  Ask your health care provider which neck exercises would be best for you. Exercises Neck Press Repeat this exercise 10 times. Do it first thing in the morning  and right before bed or as told by your health care provider. 1. Lie on your back on a firm bed or on the floor with a pillow under your head. 2. Use your neck muscles to push your head down on the pillow and straighten your spine. 3. Hold the position as well as you can. Keep your head facing up and your chin tucked. 4. Slowly count to 5 while holding this position. 5. Relax for a few seconds. Then repeat.  Isometric Strengthening Do a full set of these exercises 2 times a day or as told by your health care provider. 1. Sit in a supportive chair and place your hand on your forehead. 2. Push forward with your head and neck while pushing back with your hand. Hold for 10 seconds. 3. Relax. Then repeat the exercise 3 times. 4. Next, do thesequence again, this time putting your hand against the back of your head. Use your head and neck to push backward against the hand pressure. 5. Finally, do the same exercise on either side of your head, pushing sideways against the pressure of your hand.  Prone Head Lifts Repeat this exercise 5 times. Do this 2 times a day or as told by your health care provider. 1. Lie face-down, resting on your elbows so that your chest and upper back are raised. 2. Start with your head facing downward, near your chest. Position your chin either on or near your chest. 3. Slowly lift your head upward.  Lift until you are looking straight ahead. Then continue lifting your head as far back as you can stretch. 4. Hold your head up for 5 seconds. Then slowly lower it to your starting position.  Supine Head Lifts Repeat this exercise 8-10 times. Do this 2 times a day or as told by your health care provider. 1. Lie on your back, bending your knees to point to the ceiling and keeping your feet flat on the floor. 2. Lift your head slowly off the floor, raising your chin toward your chest. 3. Hold for 5 seconds. 4. Relax and repeat.  Scapular Retraction Repeat this exercise 5  times. Do this 2 times a day or as told by your health care provider. 1. Stand with your arms at your sides. Look straight ahead. 2. Slowly pull both shoulders backward and downward until you feel a stretch between your shoulder blades in your upper back. 3. Hold for 10-30 seconds. 4. Relax and repeat.  Contact a health care provider if:  Your neck pain or discomfort gets much worse when you do an exercise.  Your neck pain or discomfort does not improve within 2 hours after you exercise. If you have any of these problems, stop exercising right away. Do not do the exercises again unless your health care provider says that you can. Get help right away if:  You develop sudden, severe neck pain. If this happens, stop exercising right away. Do not do the exercises again unless your health care provider says that you can. Exercises Neck Stretch  Repeat this exercise 3-5 times. 1. Do this exercise while standing or while sitting in a chair. 2. Place your feet flat on the floor, shoulder-width apart. 3. Slowly turn your head to the right. Turn it all the way to the right so you can look over your right shoulder. Do not tilt or tip your head. 4. Hold this position for 10-30 seconds. 5. Slowly turn your head to the left, to look over your left shoulder. 6. Hold this position for 10-30 seconds.  Neck Retraction Repeat this exercise 8-10 times. Do this 3-4 times a day or as told by your health care provider. 1. Do this exercise while standing or while sitting in a sturdy chair. 2. Look straight ahead. Do not bend your neck. 3. Use your fingers to push your chin backward. Do not bend your neck for this movement. Continue to face straight ahead. If you are doing the exercise properly, you will feel a slight sensation in your throat and a stretch at the back of your neck. 4. Hold the stretch for 1-2 seconds. Relax and repeat.  This information is not intended to replace advice given to you by your  health care provider. Make sure you discuss any questions you have with your health care provider. Document Released: 11/30/2014 Document Revised: 05/27/2015 Document Reviewed: 06/29/2014 Elsevier Interactive Patient Education  [employer].

## 2017-07-09 NOTE — Progress Notes (Signed)
I agree with the above plan 

## 2017-07-16 MED FILL — BYDUREON BCise 2 MG/0.85ML: 2 | 28 days supply | Qty: 3 | Fill #2

## 2017-07-16 MED FILL — LISINOPRIL-HCTZ 10-12.5 MG: 10-12.5 | 90 days supply | Qty: 90 | Fill #0

## 2017-07-23 DIAGNOSIS — E7800 Pure hypercholesterolemia, unspecified: Secondary | ICD-10-CM | POA: Diagnosis not present

## 2017-07-23 DIAGNOSIS — I672 Cerebral atherosclerosis: Secondary | ICD-10-CM | POA: Diagnosis not present

## 2017-07-23 DIAGNOSIS — E1169 Type 2 diabetes mellitus with other specified complication: Secondary | ICD-10-CM | POA: Diagnosis not present

## 2017-07-23 MED FILL — CLOPIDOGREL 75 MG TABLET: 75 | 30 days supply | Qty: 30 | Fill #2

## 2017-07-23 MED FILL — GABAPENTIN 100 MG CAP: 100 | 30 days supply | Qty: 90 | Fill #0

## 2017-07-31 MED FILL — NAPROXEN 500 MG TABLET: 500 | 30 days supply | Qty: 60 | Fill #3

## 2017-07-31 MED FILL — ATORVASTATIN 40 MG TABLET: 40 | 30 days supply | Qty: 30 | Fill #1

## 2017-08-14 MED FILL — BYDUREON BCise 2 MG/0.85ML: 2 | 28 days supply | Qty: 3 | Fill #3

## 2017-08-27 MED FILL — CLOPIDOGREL 75 MG TABLET: 75 | 90 days supply | Qty: 90 | Fill #0

## 2017-08-27 MED FILL — ATORVASTATIN 40 MG TABLET: 40 | 30 days supply | Qty: 30 | Fill #2

## 2017-08-30 MED FILL — metFORMIN HCL 1000 MG TABS: 1000 | 90 days supply | Qty: 180 | Fill #0

## 2017-09-06 MED FILL — TRESIBA FLEXTOUCH 100 UNITS: 100 | 75 days supply | Qty: 15 | Fill #1

## 2017-09-07 MED FILL — BYDUREON BCise 2 MG/0.85ML: 2 | 28 days supply | Qty: 3 | Fill #0

## 2017-09-11 MED FILL — FREESTYLE LANCETS: 90 days supply | Qty: 200 | Fill #1

## 2017-09-12 DIAGNOSIS — Z6828 Body mass index (BMI) 28.0-28.9, adult: Secondary | ICD-10-CM | POA: Diagnosis not present

## 2017-09-12 DIAGNOSIS — N390 Urinary tract infection, site not specified: Secondary | ICD-10-CM | POA: Diagnosis not present

## 2017-09-12 DIAGNOSIS — Z01419 Encounter for gynecological examination (general) (routine) without abnormal findings: Secondary | ICD-10-CM | POA: Diagnosis not present

## 2017-09-14 MED FILL — SULFAMETHOXAZOLE-TMP DS TAB: 800-160 | 5 days supply | Qty: 10 | Fill #0

## 2017-09-26 MED FILL — ATORVASTATIN 40 MG TABLET: 40 | 30 days supply | Qty: 30 | Fill #3

## 2017-09-26 MED FILL — NAPROXEN 500 MG TABLET: 500 | 30 days supply | Qty: 60 | Fill #0

## 2017-09-27 DIAGNOSIS — Z1231 Encounter for screening mammogram for malignant neoplasm of breast: Secondary | ICD-10-CM | POA: Diagnosis not present

## 2017-10-09 DIAGNOSIS — Z13820 Encounter for screening for osteoporosis: Secondary | ICD-10-CM | POA: Diagnosis not present

## 2017-10-09 MED FILL — BYDUREON BCise 2 MG/0.85ML: 2 | 28 days supply | Qty: 3 | Fill #0

## 2017-10-09 MED FILL — UNIFINE PENTIPS 32GX5/32: 32G X 4 MM | 90 days supply | Qty: 100 | Fill #0

## 2017-10-09 MED FILL — UNIFINE PENTIPS 32GX5/32": 32G X 4 MM | 90 days supply | Qty: 100 | Fill #0

## 2017-10-17 MED FILL — LISINOPRIL-HCTZ 10-12.5 MG: 10-12.5 | 90 days supply | Qty: 90 | Fill #1

## 2017-10-17 MED FILL — GABAPENTIN 100 MG CAP: 100 | 30 days supply | Qty: 90 | Fill #1

## 2017-10-24 DIAGNOSIS — E1169 Type 2 diabetes mellitus with other specified complication: Secondary | ICD-10-CM | POA: Diagnosis not present

## 2017-10-24 DIAGNOSIS — E1140 Type 2 diabetes mellitus with diabetic neuropathy, unspecified: Secondary | ICD-10-CM | POA: Diagnosis not present

## 2017-10-24 DIAGNOSIS — I10 Essential (primary) hypertension: Secondary | ICD-10-CM | POA: Diagnosis not present

## 2017-10-24 DIAGNOSIS — E7800 Pure hypercholesterolemia, unspecified: Secondary | ICD-10-CM | POA: Diagnosis not present

## 2017-10-24 DIAGNOSIS — Z23 Encounter for immunization: Secondary | ICD-10-CM | POA: Diagnosis not present

## 2017-10-31 DIAGNOSIS — M25562 Pain in left knee: Secondary | ICD-10-CM | POA: Diagnosis not present

## 2017-10-31 DIAGNOSIS — M25561 Pain in right knee: Secondary | ICD-10-CM | POA: Diagnosis not present

## 2017-10-31 MED FILL — ATORVASTATIN 40 MG TABLET: 40 | 30 days supply | Qty: 30 | Fill #4

## 2017-11-09 MED FILL — TRESIBA FLEXTOUCH 100 UNITS: 100 | 30 days supply | Qty: 30 | Fill #0

## 2017-11-21 DIAGNOSIS — M25561 Pain in right knee: Secondary | ICD-10-CM | POA: Diagnosis not present

## 2017-11-26 MED FILL — ATORVASTATIN 40 MG TABLET: 40 | 30 days supply | Qty: 30 | Fill #5

## 2017-11-26 MED FILL — CLOPIDOGREL 75 MG TABLET: 75 | 90 days supply | Qty: 90 | Fill #1

## 2017-11-26 MED FILL — metFORMIN HCL 1000 MG TABS: 1000 | 90 days supply | Qty: 180 | Fill #1

## 2017-11-26 MED FILL — NAPROXEN 500 MG TABLET: 500 | 30 days supply | Qty: 60 | Fill #1

## 2017-12-03 MED FILL — OZEMPIC 0.25 OR 0.5 MG/DOSE: 2 | 28 days supply | Qty: 2 | Fill #0

## 2017-12-06 MED FILL — SHINGRIX 50 MCG SUS: 50 | 1 days supply | Qty: 1 | Fill #0

## 2017-12-19 ENCOUNTER — Encounter: Payer: Self-pay | Admitting: Adult Health

## 2017-12-19 ENCOUNTER — Ambulatory Visit (INDEPENDENT_AMBULATORY_CARE_PROVIDER_SITE_OTHER): Payer: BLUE CROSS/BLUE SHIELD | Admitting: Adult Health

## 2017-12-19 VITALS — BP 141/76 | HR 73 | Wt 185.8 lb

## 2017-12-19 DIAGNOSIS — E1142 Type 2 diabetes mellitus with diabetic polyneuropathy: Secondary | ICD-10-CM | POA: Diagnosis not present

## 2017-12-19 DIAGNOSIS — R232 Flushing: Secondary | ICD-10-CM

## 2017-12-19 DIAGNOSIS — Z794 Long term (current) use of insulin: Secondary | ICD-10-CM

## 2017-12-19 DIAGNOSIS — I10 Essential (primary) hypertension: Secondary | ICD-10-CM | POA: Diagnosis not present

## 2017-12-19 DIAGNOSIS — I6300 Cerebral infarction due to thrombosis of unspecified precerebral artery: Secondary | ICD-10-CM | POA: Diagnosis not present

## 2017-12-19 DIAGNOSIS — E785 Hyperlipidemia, unspecified: Secondary | ICD-10-CM | POA: Diagnosis not present

## 2017-12-19 NOTE — Progress Notes (Signed)
[facility] [address] [place]. [place] [address] [phone]       OFFICE FOLLOW UP NOTE  [name] Date of Birth:  07-14-1955 Medical Record Number:  [id number]   Reason for Referral:  hospital stroke follow up  CHIEF COMPLAINT:  Chief Complaint  Patient presents with  . Follow-up    [facility] follow up room in back hallway pt alone    HPI: Emma Duran is being seen today for initial visit in the office for right lateral thalamic and posterior limb internal capsule infarct on 05/29/2017. History obtained from patient and chart review. Reviewed all radiology images and labs personally.  [name] is a 62 y.o. female with history of hypertension, hyperlipidemia and diabetes  who presented with left high bright spots, and decreased sensation in her left face and arm.  CT head reviewed and was negative for acute stroke.  MRI reviewed and showed acute right lateral thalamic and posterior limb internal capsule infarct.  CTA head and neck showed left subclavian artery 30 to 50% stenosis, right and 150s by stenosis, multiple arthrosclerosis involving the carotid siphons less than 30%, B VA 30% of the origin, B VA 70% V3 V4 junction and superior mid BA.  2D echo showed an EF of 60 to 65%.  EEG negative for seizure activity.  LDL 156 and his patient was previously on Lipitor 40 mg a day but not compliant this was recommended to continue.  A1c 7.8 and recommended tight glycemic control with close PCP follow-up.  Patient was on aspirin 81 mg PTA and recommended DAPT for 3 weeks and then Plavix alone.  Patient was discharged home in stable condition without needed therapies.  07/03/2017 visit: Patient is being seen today for hospital follow-up and overall is doing well.  She states that she has no residual symptoms from her stroke and has returned to all previous activities.  She continues to take only Plavix (completed 3-week DAPT) without side effects of bleeding or  bruising.  Continues to take Lipitor without side effects myalgias.  Blood pressure satisfactory at 143/83.  Glucose levels have been under control per patient with them being on average 130.  She does have complaints of increased tingling and numbness in her lower extremities with left greater than right.  Denies nerve pain.  Also has complaint of area on right side of neck where she states it can feel tight and obtain a tension headache but this is intermittent and increases with stress.  States her current office does not have air conditioning at this time and as it has been overly hot in her office, she feels as though she has been dehydrated and having an increase in these neck pains.  Denies previous neck pains or trauma to the neck area.  Denies new or worsening stroke/TIA symptoms.  Interval history 12/19/2017: Patient is being seen today for six-month follow-up visit.  Overall she states she has been doing well with intermittent left hand tingling but denies pain.  She continues on Plavix without side effects of bleeding or bruising.  Continues on atorvastatin but does have complaints of foot cramping at night which has been present since June or July. PCP started her on gabapentin 100mg  nightly with some relief.  She was not taking atorvastatin regularly prior to her stroke and does endorse daily compliance since hospital discharge, concern for possible statin myalgia.  Blood pressure today 141/76.  She also has concerns of daytime and nighttime  hot flashes which have worsens as she had to discontinue her estrogen replacement due to her stroke.  No further concerns at this time.  Denies new or worsening stroke/TIA symptoms.    ROS:   14 system review of systems performed and negative with exception of appetite change, fatigue, joint pain, and numbness  PMH:  Past Medical History:  Diagnosis Date  . Abnormal breast exam    chip in right breast as marker for abnormality   . Arthritis    knee,  hands  . Diabetes mellitus    type 2  . External hemorrhoids   . GERD (gastroesophageal reflux disease)   . Heart murmur    never has caused any problems  . History of colon polyps   . History of kidney stones    surgery done  . Hyperlipidemia   . Hypertension   . Internal hemorrhoids   . Stroke [facility])     PSH:  Past Surgical History:  Procedure Laterality Date  . ABDOMINAL HYSTERECTOMY    . BREAST LUMPECTOMY  09/28/10   right breast   . COLONOSCOPY    . ESOPHAGOGASTRODUODENOSCOPY    . [place]  2010  . KNEE ARTHROSCOPY WITH MEDIAL MENISECTOMY Right 09/30/2013   Procedure: Right knee arthroscopy partial meniscectomy, debridement;  Surgeon: Nita Sells, MD;  Location: [facility];  Service: Orthopedics;  Laterality: Right;  Right knee arthroscopy partial menisectomy, debridement  . WISDOM TOOTH EXTRACTION      Social History:  Social History   Socioeconomic History  . Marital status: Married    Spouse name: Not on file  . Number of children: 1  . Years of education: Not on file  . Highest education level: Not on file  Occupational History  . Occupation: OFFICE MANAGER    Employer: [employer]  Social Needs  . Financial resource strain: Not on file  . Food insecurity:    Worry: Not on file    Inability: Not on file  . Transportation needs:    Medical: Not on file    Non-medical: Not on file  Tobacco Use  . Smoking status: Never Smoker  . Smokeless tobacco: Never Used  Substance and Sexual Activity  . Alcohol use: No    Alcohol/week: 0.0 standard drinks  . Drug use: No  . Sexual activity: Yes    Birth control/protection: Post-menopausal, None    Comment: hysterectomy  Lifestyle  . Physical activity:    Days per week: Not on file    Minutes per session: Not on file  . Stress: Not on file  Relationships  . Social connections:    Talks on phone: Not on file    Gets together: Not on file    Attends religious  service: Not on file    Active member of club or organization: Not on file    Attends meetings of clubs or organizations: Not on file    Relationship status: Not on file  . Intimate partner violence:    Fear of current or ex partner: Not on file    Emotionally abused: Not on file    Physically abused: Not on file    Forced sexual activity: Not on file  Other Topics Concern  . Not on file  Social History Narrative  . Not on file    Family History:  Family History  Problem Relation Age of Onset  . Diabetes Mother   . Lung cancer Father   . Cancer  Father 9       lung cancer   . Cancer Paternal Grandfather   . Lung cancer Paternal Grandmother   . Hypertension Other   . Colon cancer Neg Hx   . Stomach cancer Neg Hx   . Esophageal cancer Neg Hx   . Colon polyps Neg Hx   . Rectal cancer Neg Hx     Medications:   Current Outpatient Medications on File Prior to Visit  Medication Sig Dispense Refill  . atorvastatin (LIPITOR) 40 MG tablet Take 40 mg by mouth daily.    Marland Kitchen BYDUREON BCISE 2 MG/0.85ML AUIJ Inject 2 mg as directed once a week.  3  . clopidogrel (PLAVIX) 75 MG tablet Take 1 tablet (75 mg total) by mouth daily. 90 tablet 3  . lisinopril-hydrochlorothiazide (PRINZIDE,ZESTORETIC) 10-12.5 MG per tablet Take 1 tablet by mouth daily.      . Magnesium 250 MG TABS Take by mouth. 1 time daily    . metFORMIN (GLUCOPHAGE) 1000 MG tablet Take 1,000 mg by mouth 2 (two) times daily with a meal.  1  . naproxen (NAPROSYN) 500 MG tablet Take 500 mg by mouth daily.     . Potassium Bicarbonate 99 MG CAPS Take 99 mg by mouth daily.    . TRESIBA FLEXTOUCH 100 UNIT/ML SOPN FlexTouch Pen Inject 33 Units as directed daily.   0  . VITAMIN D, ERGOCALCIFEROL, PO Take 2,000 Int'l Units by mouth daily.      No current facility-administered medications on file prior to visit.     Allergies:   Allergies  Allergen Reactions  . Omeprazole     Full body hives and itching   . Morphine   .  Percocet [Oxycodone-Acetaminophen] Other (See Comments)    Patient states "Pain med doesn't work"  . Tramadol   . Morphine And Related Anxiety     Physical Exam  Vitals:   12/19/17 1310  BP: (!) 141/76  Pulse: 73  Weight: 185 lb 12.8 oz (84.3 kg)   Body mass index is 29.99 kg/m. No exam data present  General: well developed, well nourished, pleasant middle-aged Caucasian female, seated, in no evident distress Head: head normocephalic and atraumatic.   Neck: supple with no carotid or supraclavicular bruits Cardiovascular: regular rate and rhythm, no murmurs Musculoskeletal: no deformity; muscle tension noted on right posterior neck Skin:  no rash/petichiae Vascular:  Normal pulses all extremities  Neurologic Exam Mental Status: Awake and fully alert. Oriented to place and time. Recent and remote memory intact. Attention span, concentration and fund of knowledge appropriate. Mood and affect appropriate.  Cranial Nerves:  Pupils equal, briskly reactive to light. Extraocular movements full without nystagmus. Visual Hellen full to confrontation. Hearing intact. Facial sensation intact. Face, tongue, palate moves normally and symmetrically.  Motor: Normal bulk and tone. Normal strength in all tested extremity muscles. Sensory.:  Equal sensation through all tested extremities Coordination: Rapid alternating movements normal in all extremities. Finger-to-nose and heel-to-shin performed accurately bilaterally. Gait and Station: Arises from chair without difficulty. Stance is normal. Gait demonstrates normal stride length and balance . Able to heel, toe and tandem walk without difficulty.  Reflexes: 1+ and symmetric. Toes downgoing.        Diagnostic Data (Labs, Imaging, Testing)  CT HEAD WO CONTRAST 05/29/2017 IMPRESSION: 1. No acute finding. Mild age related atrophy. Extensive atherosclerotic calcification of the major vessels at the base of the brain. 2. ASPECTS is 10.  MR  BRAIN WO CONTRAST 05/29/2017 IMPRESSION: 1.  Acute nonhemorrhagic infarct of the lateral right thalamus and posterior limb right internal capsule measures up to 8 mm. 2. MRI otherwise normal for age.  CT ANGIO HEAD/NECK W OR WO CONTRAST 05/29/2017 IMPRESSION: Aortic atherosclerosis. 30-50% stenosis of the left subclavian artery origin. Mild atherosclerotic change at the carotid bifurcations without stenosis. Atherosclerotic change in the carotid siphon regions but without stenosis greater than 30%. 50% stenosis of the distal right M1 segment. 30% stenosis at both vertebral artery origins. 70% or greater stenosis at the V3 V4 junction of each vertebral artery. Severe stenosis of the mid basilar, but with distal flow. Both posterior cerebral arteries receive most of there supply from the anterior circulation. There are probably small communications between the posterior cerebral arteries and the basilar tip.    ASSESSMENT: Emma Duran is a 62 y.o. year old female here with right lateral thalamus and posterior limb internal capsule on 05/29/2017 secondary to small vessel disease. Vascular risk factors include HLD, HTN and DM.  Patient is being seen today for follow-up visit with intermittent left hand numbness but otherwise no other residual stroke deficit.    PLAN: -Continue clopidogrel 75 mg daily for secondary stroke prevention -Due to possible statin myalgia, it was recommended to trial stopping atorvastatin to see if the symptoms resolve.  It was also recommended to discontinue gabapentin starting Friday and we will call patient on Monday to see if she has experienced continued symptoms -Spoke to patient regarding possibly initiating Cymbalta for hot flashes and this may be considered in the future as she is not appropriate for hormone replacement -F/u with PCP regarding your HLD, HTN and DM management -continue to monitor BP at home -Maintain strict control of hypertension with  blood pressure goal below 130/90, diabetes with hemoglobin A1c goal below 6.5% and cholesterol with LDL cholesterol (bad cholesterol) goal below 70 mg/dL. I also advised the patient to eat a healthy diet with plenty of whole grains, cereals, fruits and vegetables, exercise regularly and maintain ideal body weight.  Follow up in 6 months or call earlier if needed   Greater than 50% of time during this 25 minute visit was spent on counseling,explanation of diagnosis of right lateral thalamus and posterior limb internal capsule, reviewing risk factor management of HLD, HTN and DM, planning of further management, discussion with patient and family and coordination of care    Venancio Poisson, [facility]  [facility] [address] [place], [place] [address]  Phone [phone] Fax [phone]

## 2017-12-19 NOTE — Patient Instructions (Signed)
Continue clopidogrel 75 mg daily  for secondary stroke prevention  Stop lipitor starting tomorrow and do not take until Monday. Do not take gabapentin Friday night or over the weekend. We will call you on Monday to see how your symptoms are doing.   Continue to follow up with PCP regarding cholesterol, diabetes and blood pressure management   Continue to monitor blood pressure at home  Maintain strict control of hypertension with blood pressure goal below 130/90, diabetes with hemoglobin A1c goal below 6.5% and cholesterol with LDL cholesterol (bad cholesterol) goal below 70 mg/dL. I also advised the patient to eat a healthy diet with plenty of whole grains, cereals, fruits and vegetables, exercise regularly and maintain ideal body weight.  Followup in the future with me in 6 months or call earlier if needed       Thank you for coming to see [place] at [facility]. I hope we have been able to provide you high quality care today.  You may receive a patient satisfaction survey over the next few weeks. We would appreciate your feedback and comments so that we may continue to improve ourselves and the health of our patients.

## 2017-12-31 DIAGNOSIS — M1711 Unilateral primary osteoarthritis, right knee: Secondary | ICD-10-CM | POA: Diagnosis not present

## 2018-01-06 NOTE — Progress Notes (Signed)
I agree with the above plan 

## 2018-01-10 MED FILL — LISINOPRIL-HCTZ 10-12.5 MG: 10-12.5 | 90 days supply | Qty: 90 | Fill #0

## 2018-01-16 MED FILL — UNIFINE PENTIPS 32GX5/32: 32G X 4 MM | 90 days supply | Qty: 100 | Fill #1

## 2018-01-16 MED FILL — UNIFINE PENTIPS 32GX5/32": 32G X 4 MM | 90 days supply | Qty: 100 | Fill #1

## 2018-01-25 MED FILL — NAPROXEN 500 MG TABLET: 500 | 30 days supply | Qty: 60 | Fill #2

## 2018-02-21 MED FILL — SHINGRIX 50 MCG SUS: 50 | 1 days supply | Qty: 1 | Fill #1

## 2018-02-22 MED FILL — CLOPIDOGREL 75 MG TABLET: 75 | 90 days supply | Qty: 90 | Fill #2

## 2018-02-28 MED FILL — metFORMIN HCL 1000 MG TABS: 1000 | 90 days supply | Qty: 180 | Fill #0

## 2018-03-05 MED FILL — PRAVASTATIN NA 40 MG TAB: 40 | 30 days supply | Qty: 30 | Fill #0

## 2018-03-19 MED FILL — NAPROXEN 500 MG TABLET: 500 | 30 days supply | Qty: 60 | Fill #0 | Status: TO

## 2018-04-09 MED FILL — LISINOPRIL-HCTZ 10-12.5 MG: 10-12.5 | 90 days supply | Qty: 90 | Fill #0

## 2018-04-09 MED FILL — PRAVASTATIN NA 40 MG TAB: 40 | 90 days supply | Qty: 90 | Fill #0

## 2018-04-19 DIAGNOSIS — Z0000 Encounter for general adult medical examination without abnormal findings: Secondary | ICD-10-CM | POA: Diagnosis not present

## 2018-05-20 MED FILL — CLOPIDOGREL 75 MG TABLET: 75 | 90 days supply | Qty: 90 | Fill #0

## 2018-05-20 MED FILL — NAPROXEN 500 MG TABLET: 500 | 30 days supply | Qty: 60 | Fill #0

## 2018-05-28 MED FILL — metFORMIN HCL 1000 MG TABS: 1000 | 90 days supply | Qty: 180 | Fill #1

## 2018-06-18 ENCOUNTER — Telehealth: Payer: Self-pay

## 2018-06-18 NOTE — Telephone Encounter (Signed)
I called pt that her appt will be change to mychart video visit due to COVID 19. Pt stated she will be cancelling because she has 6000 deducible that she has not met. Pt will call back to r/s if needed. I stated plavix refills will have to be manage by her PCP ongoing. Pt understood and will reach out to PCP.

## 2018-06-19 DIAGNOSIS — M1712 Unilateral primary osteoarthritis, left knee: Secondary | ICD-10-CM | POA: Diagnosis not present

## 2018-06-19 DIAGNOSIS — M1711 Unilateral primary osteoarthritis, right knee: Secondary | ICD-10-CM | POA: Diagnosis not present

## 2018-06-20 ENCOUNTER — Ambulatory Visit: Payer: BLUE CROSS/BLUE SHIELD | Admitting: Adult Health

## 2018-07-08 MED FILL — LISINOPRIL-HCTZ 10-12.5 MG: 10-12.5 | 90 days supply | Qty: 90 | Fill #0

## 2018-07-08 MED FILL — PRAVASTATIN NA 40 MG TAB: 40 | 90 days supply | Qty: 90 | Fill #0

## 2018-07-25 MED FILL — NAPROXEN 500 MG TABLET: 500 | 30 days supply | Qty: 60 | Fill #0

## 2018-08-15 DIAGNOSIS — I10 Essential (primary) hypertension: Secondary | ICD-10-CM | POA: Diagnosis not present

## 2018-08-15 DIAGNOSIS — E1169 Type 2 diabetes mellitus with other specified complication: Secondary | ICD-10-CM | POA: Diagnosis not present

## 2018-08-15 DIAGNOSIS — E7800 Pure hypercholesterolemia, unspecified: Secondary | ICD-10-CM | POA: Diagnosis not present

## 2018-08-19 MED FILL — CLOPIDOGREL 75 MG TABLET: 75 | 90 days supply | Qty: 90 | Fill #0

## 2018-08-21 DIAGNOSIS — I672 Cerebral atherosclerosis: Secondary | ICD-10-CM | POA: Diagnosis not present

## 2018-08-21 DIAGNOSIS — I10 Essential (primary) hypertension: Secondary | ICD-10-CM | POA: Diagnosis not present

## 2018-08-21 DIAGNOSIS — E1169 Type 2 diabetes mellitus with other specified complication: Secondary | ICD-10-CM | POA: Diagnosis not present

## 2018-08-21 DIAGNOSIS — E7800 Pure hypercholesterolemia, unspecified: Secondary | ICD-10-CM | POA: Diagnosis not present

## 2018-08-30 DIAGNOSIS — N179 Acute kidney failure, unspecified: Secondary | ICD-10-CM | POA: Diagnosis not present

## 2018-09-12 DIAGNOSIS — N179 Acute kidney failure, unspecified: Secondary | ICD-10-CM | POA: Diagnosis not present

## 2018-09-16 DIAGNOSIS — Z01419 Encounter for gynecological examination (general) (routine) without abnormal findings: Secondary | ICD-10-CM | POA: Diagnosis not present

## 2018-09-16 DIAGNOSIS — Z6827 Body mass index (BMI) 27.0-27.9, adult: Secondary | ICD-10-CM | POA: Diagnosis not present

## 2018-09-17 ENCOUNTER — Other Ambulatory Visit: Payer: Self-pay | Admitting: Family Medicine

## 2018-09-17 DIAGNOSIS — N179 Acute kidney failure, unspecified: Secondary | ICD-10-CM

## 2018-09-19 ENCOUNTER — Ambulatory Visit
Admission: RE | Admit: 2018-09-19 | Discharge: 2018-09-19 | Disposition: A | Discharge status: Home / Self Care | Payer: BLUE CROSS/BLUE SHIELD | Source: Ambulatory Visit | Attending: Family Medicine | Admitting: Family Medicine

## 2018-09-19 DIAGNOSIS — N179 Acute kidney failure, unspecified: Secondary | ICD-10-CM | POA: Diagnosis not present

## 2018-09-30 DIAGNOSIS — Z1231 Encounter for screening mammogram for malignant neoplasm of breast: Secondary | ICD-10-CM | POA: Diagnosis not present

## 2018-10-08 MED FILL — PRAVASTATIN NA 40 MG TAB: 40 | 90 days supply | Qty: 90 | Fill #1

## 2018-10-09 DIAGNOSIS — Z23 Encounter for immunization: Secondary | ICD-10-CM | POA: Diagnosis not present

## 2018-10-09 DIAGNOSIS — N179 Acute kidney failure, unspecified: Secondary | ICD-10-CM | POA: Diagnosis not present

## 2018-10-28 DIAGNOSIS — N179 Acute kidney failure, unspecified: Secondary | ICD-10-CM | POA: Diagnosis not present

## 2018-10-30 MED FILL — DILTIAZEM HCL ER 180 MG CAP: 180 | 30 days supply | Qty: 30 | Fill #0

## 2018-11-18 DIAGNOSIS — I129 Hypertensive chronic kidney disease with stage 1 through stage 4 chronic kidney disease, or unspecified chronic kidney disease: Secondary | ICD-10-CM | POA: Diagnosis not present

## 2018-11-18 DIAGNOSIS — N183 Chronic kidney disease, stage 3 unspecified: Secondary | ICD-10-CM | POA: Diagnosis not present

## 2018-11-18 DIAGNOSIS — N179 Acute kidney failure, unspecified: Secondary | ICD-10-CM | POA: Diagnosis not present

## 2018-11-18 DIAGNOSIS — D631 Anemia in chronic kidney disease: Secondary | ICD-10-CM | POA: Diagnosis not present

## 2018-11-18 MED FILL — CLOPIDOGREL 75 MG TABLET: 75 | 90 days supply | Qty: 90 | Fill #0

## 2018-11-19 ENCOUNTER — Other Ambulatory Visit: Payer: Self-pay | Admitting: Nephrology

## 2018-11-19 DIAGNOSIS — N289 Disorder of kidney and ureter, unspecified: Secondary | ICD-10-CM

## 2018-11-19 DIAGNOSIS — N183 Chronic kidney disease, stage 3 unspecified: Secondary | ICD-10-CM

## 2018-11-27 ENCOUNTER — Ambulatory Visit
Admission: RE | Admit: 2018-11-27 | Discharge: 2018-11-27 | Disposition: A | Discharge status: Home / Self Care | Payer: BLUE CROSS/BLUE SHIELD | Source: Ambulatory Visit | Attending: Nephrology | Admitting: Nephrology

## 2018-11-27 DIAGNOSIS — N1330 Unspecified hydronephrosis: Secondary | ICD-10-CM | POA: Diagnosis not present

## 2018-11-27 DIAGNOSIS — N183 Chronic kidney disease, stage 3 unspecified: Secondary | ICD-10-CM

## 2018-11-27 DIAGNOSIS — N289 Disorder of kidney and ureter, unspecified: Secondary | ICD-10-CM

## 2018-11-27 MED FILL — DILTIAZEM HCL ER 180 MG CAP: 180 | 30 days supply | Qty: 30 | Fill #1

## 2018-12-17 DIAGNOSIS — N183 Chronic kidney disease, stage 3 unspecified: Secondary | ICD-10-CM | POA: Diagnosis not present

## 2018-12-17 DIAGNOSIS — D631 Anemia in chronic kidney disease: Secondary | ICD-10-CM | POA: Diagnosis not present

## 2018-12-17 DIAGNOSIS — I129 Hypertensive chronic kidney disease with stage 1 through stage 4 chronic kidney disease, or unspecified chronic kidney disease: Secondary | ICD-10-CM | POA: Diagnosis not present

## 2018-12-17 DIAGNOSIS — N179 Acute kidney failure, unspecified: Secondary | ICD-10-CM | POA: Diagnosis not present

## 2018-12-17 MED FILL — DILT-XR 240 MG CAP SA: 240 | 30 days supply | Qty: 30 | Fill #0

## 2018-12-24 MED FILL — UNIFINE PENTIPS 32GX5/32: 32G X 4 MM | 90 days supply | Qty: 100 | Fill #0

## 2018-12-24 MED FILL — UNIFINE PENTIPS 32GX5/32": 32G X 4 MM | 90 days supply | Qty: 100 | Fill #0

## 2018-12-30 DIAGNOSIS — S61451A Open bite of right hand, initial encounter: Secondary | ICD-10-CM | POA: Diagnosis not present

## 2018-12-30 MED FILL — AMOX-CLAV 500-125 MG TABLET: 500-125 | 10 days supply | Qty: 20 | Fill #0

## 2019-01-01 DIAGNOSIS — W5501XD Bitten by cat, subsequent encounter: Secondary | ICD-10-CM | POA: Diagnosis not present

## 2019-01-01 DIAGNOSIS — S61451D Open bite of right hand, subsequent encounter: Secondary | ICD-10-CM | POA: Diagnosis not present

## 2019-01-04 IMAGING — CT CT HEAD CODE STROKE
4 series · 16 of 47 positions shown, 18 images · non-contrast
Comparison: 12/07/2012 .

CLINICAL DATA: Code stroke. Left-sided numbness. Last seen normal
3333 hours

EXAM:
CT HEAD WITHOUT CONTRAST
TECHNIQUE: Contiguous axial images were obtained from the base of the skull
through the vertex without intravenous contrast.

[Series 3: head 5.0 st · axial · 0.45mm/px · z∈[-96,+24]mm · 7 of 32 slices shown, 9 images]
[im 4/32  brain]
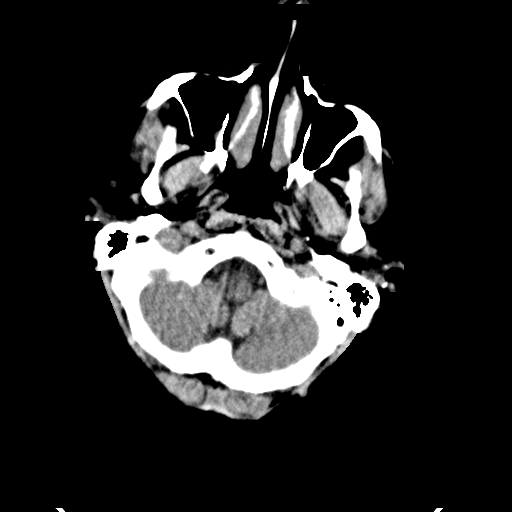
[im 4/32  bone]
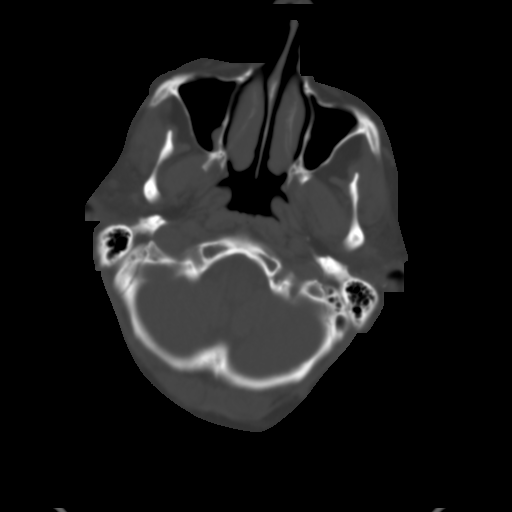
[im 8/32  brain]
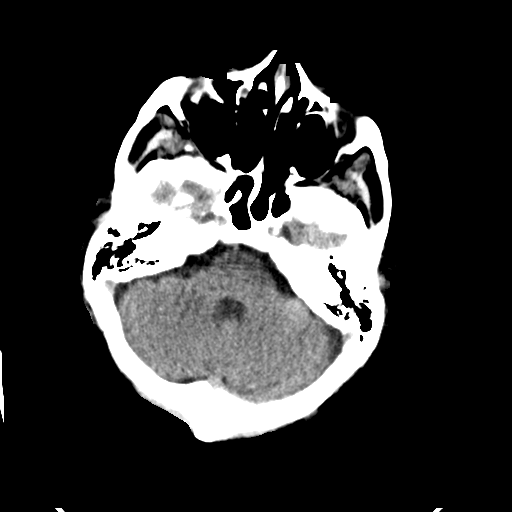
[im 12/32  brain]
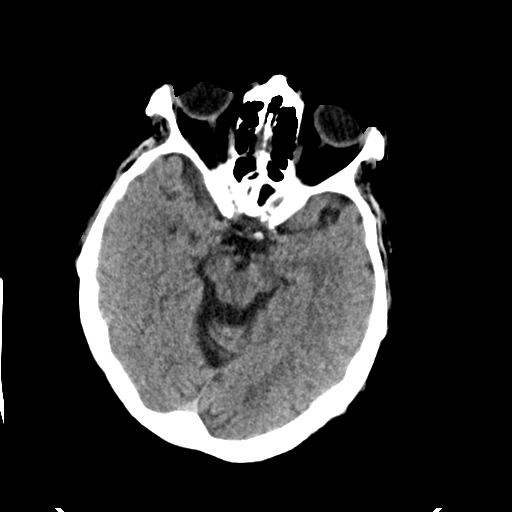
[im 16/32  brain]
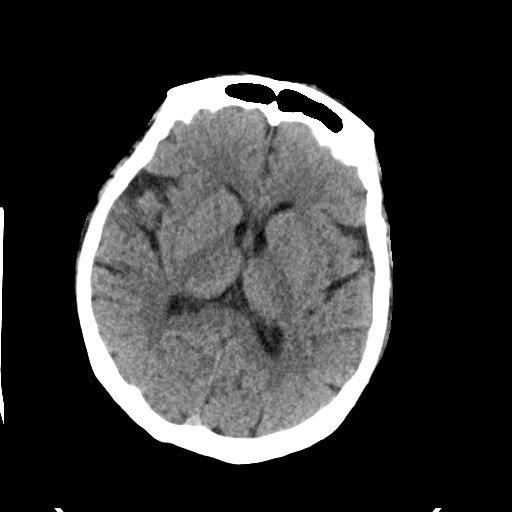
[im 20/32  brain]
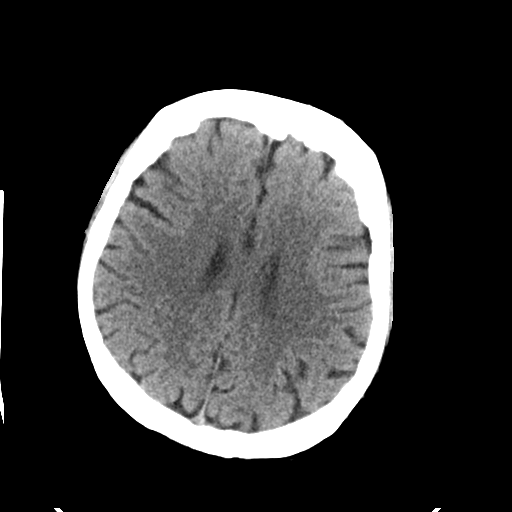
[im 20/32  bone]
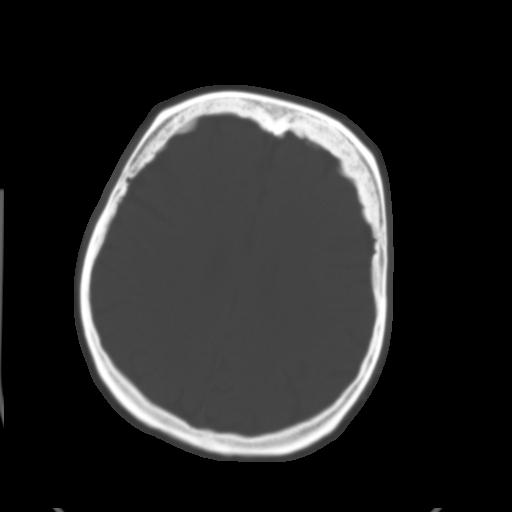
[im 24/32  brain]
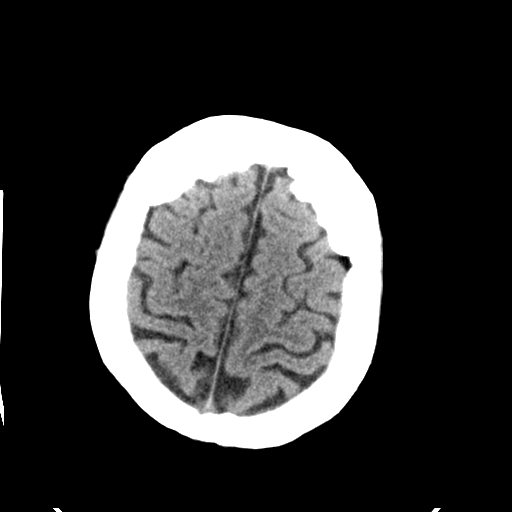
[im 28/32  brain]
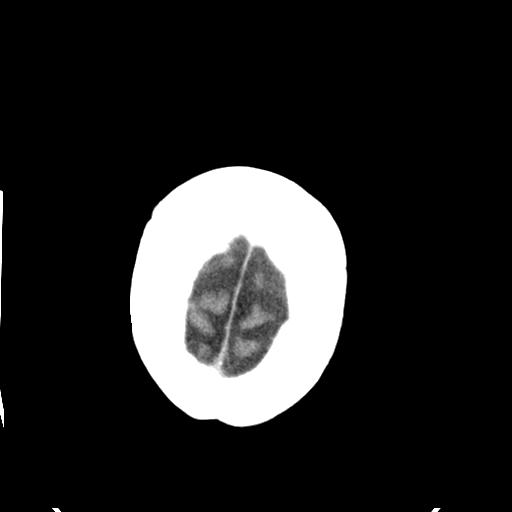

[Series 4: head 2.0 bone · axial · 0.45mm/px · z∈[-97,-65]mm · 3 of 80 slices shown]
[im 8/80  bone]
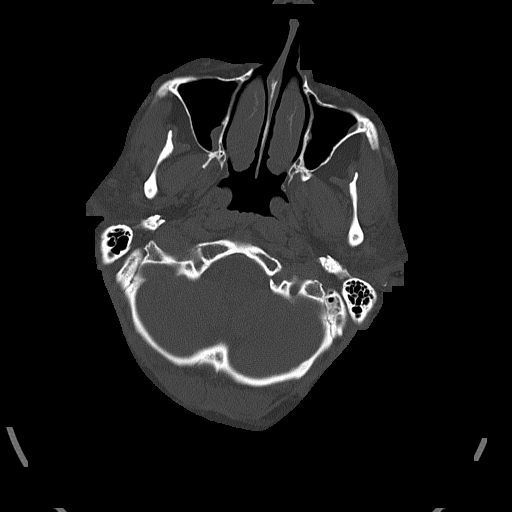
[im 16/80  bone]
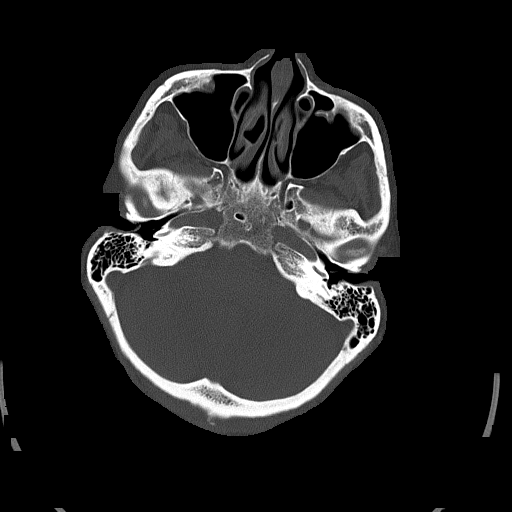
[im 24/80  bone]
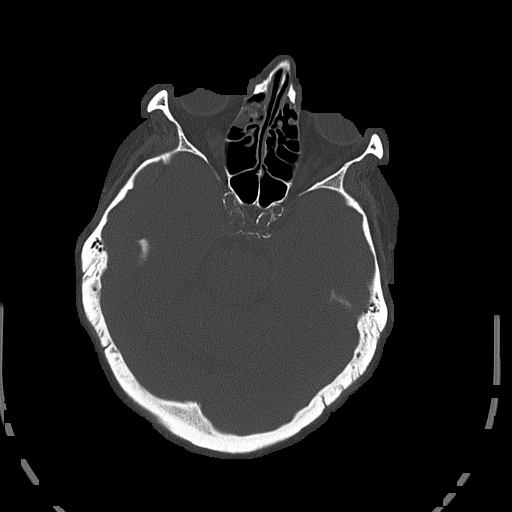

[Series 5: head 3.0 cor st · coronal · 0.33mm/px · 3 of 67 slices shown]
[im 23/67  brain]
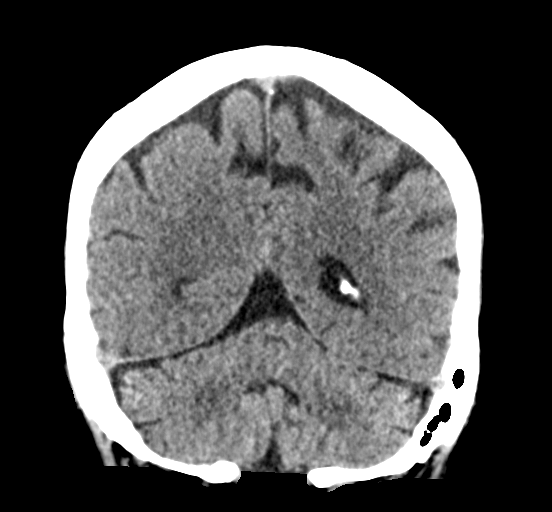
[im 30/67  brain]
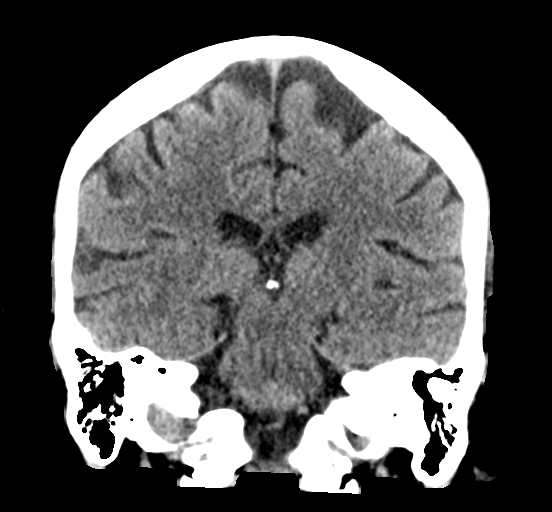
[im 37/67  brain]
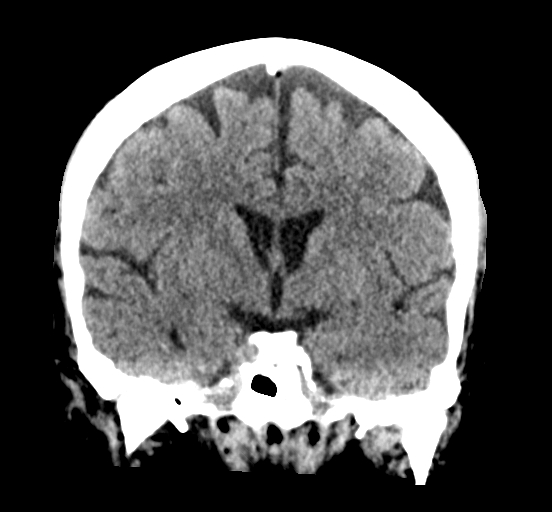

[Series 6: head 3.0 sag st · sagittal · 0.32mm/px · 3 of 62 slices shown]
[im 21/62  brain]
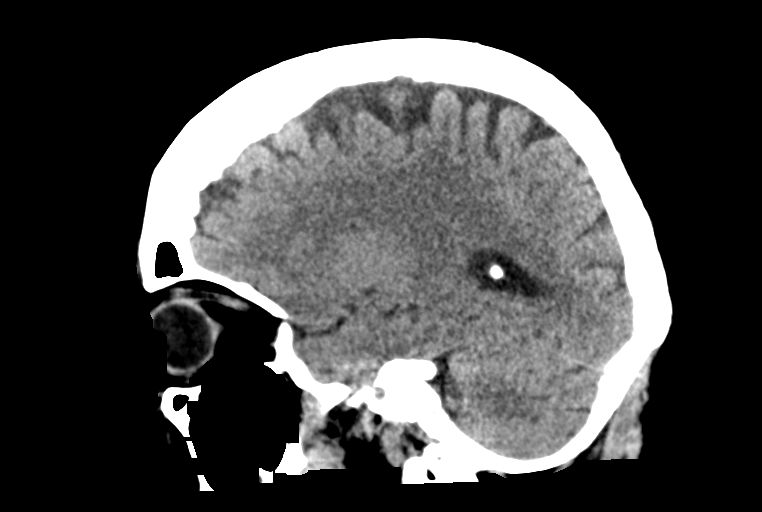
[im 31/62  brain]
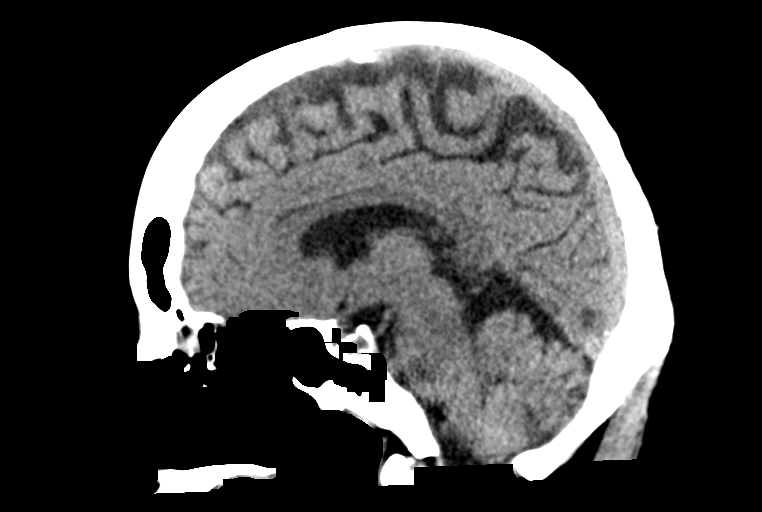
[im 41/62  brain]
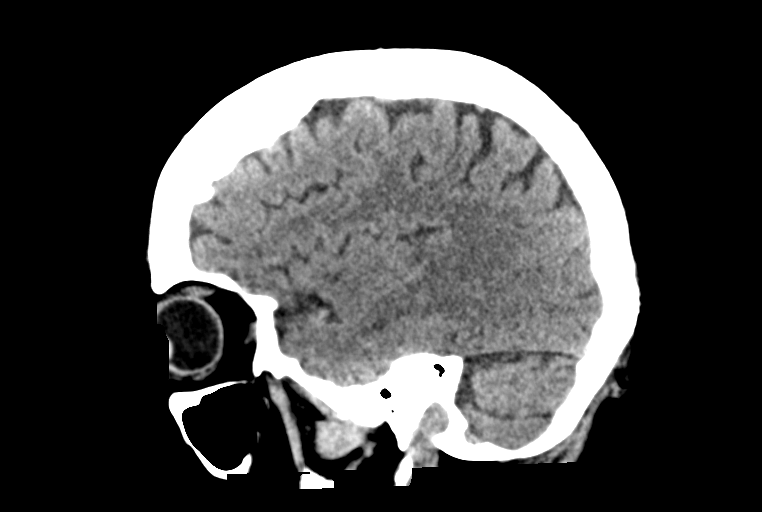

[16 of 47 positions shown; findings below may reference images not displayed]

FINDINGS: Brain: Mild age related atrophy as seen previously. No sign of acute
infarction, mass lesion, hemorrhage, hydrocephalus or extra-axial
collection.

Vascular: There is atherosclerotic calcification of the major
vessels at the base of the brain.

Skull: Negative

Sinuses/Orbits: Clear/normal

Other: None

ASPECTS (Alberta Stroke Program Early CT Score)

- Ganglionic level infarction (caudate, lentiform nuclei, internal
capsule, insula, M1-M3 cortex): 7

- Supraganglionic infarction (M4-M6 cortex): 3

Total score (0-10 with 10 being normal): 10
IMPRESSION: 1. No acute finding. Mild age related atrophy. Extensive
atherosclerotic calcification of the major vessels at the base of
the brain.
2. ASPECTS is 10.
3. These results were communicated to Dr. Sanjay Patel at [DATE] [place]
05/29/2017by text page via the [employer] messaging system.

## 2019-01-05 ENCOUNTER — Emergency Department (HOSPITAL_BASED_OUTPATIENT_CLINIC_OR_DEPARTMENT_OTHER)
Admission: EM | Admit: 2019-01-05 | Discharge: 2019-01-05 | Disposition: A | Discharge status: Home / Self Care | Payer: BC Managed Care – PPO | Attending: Emergency Medicine | Admitting: Emergency Medicine

## 2019-01-05 ENCOUNTER — Other Ambulatory Visit: Payer: Self-pay

## 2019-01-05 ENCOUNTER — Encounter (HOSPITAL_BASED_OUTPATIENT_CLINIC_OR_DEPARTMENT_OTHER): Payer: Self-pay | Admitting: Emergency Medicine

## 2019-01-05 DIAGNOSIS — I10 Essential (primary) hypertension: Secondary | ICD-10-CM | POA: Insufficient documentation

## 2019-01-05 DIAGNOSIS — S61459A Open bite of unspecified hand, initial encounter: Secondary | ICD-10-CM | POA: Diagnosis not present

## 2019-01-05 DIAGNOSIS — E119 Type 2 diabetes mellitus without complications: Secondary | ICD-10-CM | POA: Diagnosis not present

## 2019-01-05 DIAGNOSIS — R2231 Localized swelling, mass and lump, right upper limb: Secondary | ICD-10-CM | POA: Diagnosis not present

## 2019-01-05 DIAGNOSIS — Z79899 Other long term (current) drug therapy: Secondary | ICD-10-CM | POA: Insufficient documentation

## 2019-01-05 DIAGNOSIS — L02511 Cutaneous abscess of right hand: Secondary | ICD-10-CM | POA: Insufficient documentation

## 2019-01-05 DIAGNOSIS — Z7984 Long term (current) use of oral hypoglycemic drugs: Secondary | ICD-10-CM | POA: Diagnosis not present

## 2019-01-05 DIAGNOSIS — E785 Hyperlipidemia, unspecified: Secondary | ICD-10-CM | POA: Insufficient documentation

## 2019-01-05 DIAGNOSIS — Z888 Allergy status to other drugs, medicaments and biological substances status: Secondary | ICD-10-CM | POA: Diagnosis not present

## 2019-01-05 DIAGNOSIS — Z885 Allergy status to narcotic agent status: Secondary | ICD-10-CM | POA: Insufficient documentation

## 2019-01-05 LAB — CBC WITH DIFFERENTIAL/PLATELET
Abs Immature Granulocytes: 0.11 10*3/uL — ABNORMAL HIGH (ref 0.00–0.07)
Basophils Absolute: 0.1 10*3/uL (ref 0.0–0.1)
Basophils Relative: 1 %
Eosinophils Absolute: 0.2 10*3/uL (ref 0.0–0.5)
Eosinophils Relative: 2 %
HCT: 33.1 % — ABNORMAL LOW (ref 36.0–46.0)
Hemoglobin: 10.6 g/dL — ABNORMAL LOW (ref 12.0–15.0)
Immature Granulocytes: 1 %
Lymphocytes Relative: 33 %
Lymphs Abs: 2.8 10*3/uL (ref 0.7–4.0)
MCH: 28.9 pg (ref 26.0–34.0)
MCHC: 32 g/dL (ref 30.0–36.0)
MCV: 90.2 fL (ref 80.0–100.0)
Monocytes Absolute: 0.5 10*3/uL (ref 0.1–1.0)
Monocytes Relative: 6 %
Neutro Abs: 5 10*3/uL (ref 1.7–7.7)
Neutrophils Relative %: 57 %
Platelets: 350 10*3/uL (ref 150–400)
RBC: 3.67 MIL/uL — ABNORMAL LOW (ref 3.87–5.11)
RDW: 13.7 % (ref 11.5–15.5)
WBC: 8.7 10*3/uL (ref 4.0–10.5)
nRBC: 0 % (ref 0.0–0.2)

## 2019-01-05 LAB — COMPREHENSIVE METABOLIC PANEL
ALT: 14 U/L (ref 0–44)
AST: 15 U/L (ref 15–41)
Albumin: 3.6 g/dL (ref 3.5–5.0)
Alkaline Phosphatase: 61 U/L (ref 38–126)
Anion gap: 9 (ref 5–15)
BUN: 15 mg/dL (ref 8–23)
CO2: 24 mmol/L (ref 22–32)
Calcium: 9.1 mg/dL (ref 8.9–10.3)
Chloride: 106 mmol/L (ref 98–111)
Creatinine, Ser: 1.47 mg/dL — ABNORMAL HIGH (ref 0.44–1.00)
GFR calc Af Amer: 43 mL/min — ABNORMAL LOW (ref >60)
GFR calc non Af Amer: 37 mL/min — ABNORMAL LOW (ref >60)
Glucose, Bld: 245 mg/dL — ABNORMAL HIGH (ref 70–99)
Potassium: 3.6 mmol/L (ref 3.5–5.1)
Sodium: 139 mmol/L (ref 135–145)
Total Bilirubin: 0.4 mg/dL (ref 0.3–1.2)
Total Protein: 7.2 g/dL (ref 6.5–8.1)

## 2019-01-05 MED ORDER — CEFAZOLIN SODIUM-DEXTROSE 1-4 GM/50ML-% IV SOLN
1.0000 g | Freq: Once | INTRAVENOUS | Status: AC
  Administered 2019-01-05: 1 g via INTRAVENOUS
  Filled 2019-01-05: qty 50

## 2019-01-05 MED ORDER — SODIUM CHLORIDE 0.9 % IV SOLN
3.0000 g | Freq: Once | INTRAVENOUS | Status: AC
  Administered 2019-01-05: 3 g via INTRAVENOUS
  Filled 2019-01-05: qty 8

## 2019-01-05 MED ORDER — CEPHALEXIN 500 MG PO CAPS: 500.0000 mg | ORAL_CAPSULE | Freq: Four times a day (QID) | ORAL | 0 refills | Status: AC

## 2019-01-05 MED ORDER — LIDOCAINE HCL (PF) 1 % IJ SOLN
5.0000 mL | Freq: Once | INTRAMUSCULAR | Status: AC
  Administered 2019-01-05: 5 mL
  Filled 2019-01-05: qty 5

## 2019-01-05 MED ORDER — DOXYCYCLINE HYCLATE 100 MG PO TABS: 100.0000 mg | ORAL_TABLET | Freq: Two times a day (BID) | ORAL | 0 refills | Status: DC

## 2019-01-05 MED ORDER — SODIUM CHLORIDE 0.9 % IV SOLN: INTRAVENOUS | Status: DC | PRN

## 2019-01-05 NOTE — Discharge Instructions (Signed)
Do not eat or drink anything after 12 midnight tonight. You may require surgery if infection is not improving

## 2019-01-05 NOTE — ED Notes (Signed)
ED Provider at bedside. 

## 2019-01-05 NOTE — ED Provider Notes (Signed)
[place] EMERGENCY DEPARTMENT Provider Note   CSN: [id number] Arrival date & time: 01/05/19  1216     History Chief Complaint  Patient presents with  . Hand Pain    Emma Duran is a 64 y.o. female.  Pt reports her cat scratched her on 12/25.  Pt reports area began swelling.  Pt saw her Md and was put on augmentin. Pt started on Augmenin on 12/28.  Pt states she was getting better. Pt states she did have swelling up her arm.  Pt reports area is getting worse.  Pt rechecked at [facility] clinic and was sent here.  Pt unable to fully bend 3rd finger now.   The history is provided by the patient. No language interpreter was used.  Hand Pain This is a new problem.       Past Medical History:  Diagnosis Date  . Abnormal breast exam    chip in right breast as marker for abnormality   . Arthritis    knee, hands  . Diabetes mellitus    type 2  . External hemorrhoids   . GERD (gastroesophageal reflux disease)   . Heart murmur    never has caused any problems  . History of colon polyps   . History of kidney stones    surgery done  . Hyperlipidemia   . Hypertension   . Internal hemorrhoids   . Stroke [facility])     Patient Active Problem List   Diagnosis Date Noted  . Stroke ([place]) 05/29/2017  . Dysphagia, pharyngoesophageal phase 11/08/2012  . Esophageal stricture 11/08/2012  . GERD (gastroesophageal reflux disease) 11/08/2012  . Unspecified gastritis and gastroduodenitis without mention of hemorrhage 11/08/2012  . Diabetes ([place]) 05/28/2010  . Hyperlipidemia 05/28/2010  . Essential hypertension 05/28/2010    Past Surgical History:  Procedure Laterality Date  . ABDOMINAL HYSTERECTOMY    . BREAST LUMPECTOMY  09/28/10   right breast   . COLONOSCOPY    . ESOPHAGOGASTRODUODENOSCOPY    . [place]  2010  . KNEE ARTHROSCOPY WITH MEDIAL MENISECTOMY Right 09/30/2013   Procedure: Right knee arthroscopy partial meniscectomy, debridement;  Surgeon: Nita Sells, MD;  Location: [place];  Service: Orthopedics;  Laterality: Right;  Right knee arthroscopy partial menisectomy, debridement  . WISDOM TOOTH EXTRACTION       OB History   No obstetric history on file.     Family History  Problem Relation Age of Onset  . Diabetes Mother   . Lung cancer Father   . Cancer Father 26       lung cancer   . Cancer Paternal Grandfather   . Lung cancer Paternal Grandmother   . Hypertension Other   . Colon cancer Neg Hx   . Stomach cancer Neg Hx   . Esophageal cancer Neg Hx   . Colon polyps Neg Hx   . Rectal cancer Neg Hx     Social History   Tobacco Use  . Smoking status: Never Smoker  . Smokeless tobacco: Never Used  Substance Use Topics  . Alcohol use: No    Alcohol/week: 0.0 standard drinks  . Drug use: No    Home Medications Prior to Admission medications   Medication Sig Start Date End Date Taking? Authorizing Provider  amoxicillin-clavulanate (AUGMENTIN) 500-125 MG tablet Take 1 tablet by mouth 2 (two) times daily. 12/30/18  Yes [provider]  diltiazem (TIAZAC) 180 MG 24 hr capsule Take 180 mg by mouth daily.  11/27/18  Yes [provider]  Ferrous Sulfate (IRON PO) Take by mouth.   Yes [provider]  insulin degludec (TRESIBA FLEXTOUCH) 100 UNIT/ML SOPN FlexTouch Pen Tresiba FlexTouch U-100 insulin 100 unit/mL (3 mL) subcutaneous pen   Yes [provider]  Magnesium 250 MG TABS Take by mouth. 1 time daily   Yes [provider]  Potassium Bicarbonate 99 MG CAPS Take 99 mg by mouth daily.   Yes [provider]  pravastatin (PRAVACHOL) 40 MG tablet  10/08/18  Yes [provider]  TRESIBA FLEXTOUCH 100 UNIT/ML SOPN FlexTouch Pen Inject 33 Units as directed daily.  03/26/17  Yes [provider]  VITAMIN D, ERGOCALCIFEROL, PO Take 2,000 Int'l Units by mouth daily.    Yes [provider]  atorvastatin (LIPITOR) 40 MG tablet Take  40 mg by mouth daily.    [provider]  BYDUREON BCISE 2 MG/0.85ML AUIJ Inject 2 mg as directed once a week. 05/23/17   [provider]  clopidogrel (PLAVIX) 75 MG tablet Take 1 tablet (75 mg total) by mouth daily. 07/03/17   Frann Rider, NP  lisinopril-hydrochlorothiazide (PRINZIDE,ZESTORETIC) 10-12.5 MG per tablet Take 1 tablet by mouth daily.      [provider]  metFORMIN (GLUCOPHAGE) 1000 MG tablet Take 1,000 mg by mouth 2 (two) times daily with a meal. 05/31/17   [provider]  naproxen (NAPROSYN) 500 MG tablet Take 500 mg by mouth daily.     [provider]    Allergies    Omeprazole, Morphine, Percocet [oxycodone-acetaminophen], Tramadol, and Morphine and related  Review of Systems   Review of Systems  All other systems reviewed and are negative.   Physical Exam Updated Vital Signs BP (!) 190/68 (BP Location: Left Arm)   Pulse 67   Temp 98.8 F (37.1 C) (Oral)   Resp 16   Ht 5\' 6"  (1.676 m)   Wt 79.4 kg   SpO2 100%   BMI 28.25 kg/m   Physical Exam Vitals and nursing note reviewed.  Constitutional:      Appearance: She is well-developed.  HENT:     Head: Normocephalic.  Cardiovascular:     Rate and Rhythm: Normal rate.  Pulmonary:     Effort: Pulmonary effort is normal.  Abdominal:     General: There is no distension.  Musculoskeletal:        General: Swelling and tenderness present. Normal range of motion.     Cervical back: Normal range of motion.     Comments: 1.3 cm blister, redness dorsal hand, decreased range of motion 3rd finger,  nv and ns intact   Neurological:     Mental Status: She is alert and oriented to person, place, and time.  Psychiatric:        Mood and Affect: Mood normal.     ED Results / Procedures / Treatments   Labs (all labs ordered are listed, but only abnormal results are displayed)       Labs Reviewed - No data to display  EKG None  Radiology No results  found.  Procedures .Marland KitchenIncision and Drainage  Date/Time: 01/05/2019 4:14 PM Performed by: Fransico Meadow, PA-C Authorized by: Fransico Meadow, PA-C   Consent:    Consent obtained:  Verbal   Consent given by:  Patient   Risks discussed:  Incomplete drainage   Alternatives discussed:  No treatment Location:    Type:  Abscess   Size:  1.5   Location:  Upper extremity   Upper extremity location:  Hand Pre-procedure details:    Skin preparation:  Betadine Anesthesia (see MAR for exact dosages):    Anesthesia method:  Local infiltration   Local anesthetic:  Lidocaine 2% w/o epi Procedure details:    Needle aspiration: no     Scalpel blade:  11   Wound management:  Probed and deloculated, irrigated with saline and extensive cleaning   Drainage:  Purulent   Drainage amount:  Moderate   Wound treatment:  Wound left open Post-procedure details:    Patient tolerance of procedure:  Tolerated well, no immediate complications   (including critical care time)  Medications Ordered in ED Medications - No data to display  ED Course  I have reviewed the triage vital signs and the nursing notes.  Pertinent labs & imaging results that were available during my care of the patient were reviewed by me and considered in my medical decision making (see chart for details).    MDM Rules/Calculators/A&P                      MDM  I spoke to Dr. Santo Held surgeon on call.  He advised unasyn and ancef.  I and d wound.  He will see pt in the office tomorrow am at 8:30.  Pt is advised to stay npo that she may require further evaluation pt given rx for keflex and doxycycline.  Final Clinical Impression(s) / ED Diagnoses Final diagnoses:  Abscess of right hand    Rx / DC Orders ED Discharge Orders         Ordered    cephALEXin (KEFLEX) 500 MG capsule  4 times daily     01/05/19 1607    doxycycline (VIBRA-TABS) 100 MG tablet  2 times daily     01/05/19 [address] 01/05/19 1614    Davonna Belling, MD 01/06/19 1504

## 2019-01-05 NOTE — ED Notes (Signed)
Pt verbalized understanding to f/u with referral on d/c instructions tomorrow am and to pick up Rx at pharmacy listed on paperwork before 6pm tonight

## 2019-01-05 NOTE — ED Triage Notes (Addendum)
Pt scratched by her cat on R hand on christmas day. Swelling and redness noted. Has been on PO abx

## 2019-01-06 ENCOUNTER — Encounter (HOSPITAL_COMMUNITY): Payer: Self-pay | Admitting: Orthopedic Surgery

## 2019-01-06 ENCOUNTER — Other Ambulatory Visit (HOSPITAL_COMMUNITY)
Admission: RE | Admit: 2019-01-06 | Discharge: 2019-01-06 | Disposition: A | Discharge status: Home / Self Care | Payer: BC Managed Care – PPO | Source: Ambulatory Visit | Attending: Orthopedic Surgery | Admitting: Orthopedic Surgery

## 2019-01-06 ENCOUNTER — Other Ambulatory Visit: Payer: Self-pay

## 2019-01-06 DIAGNOSIS — Z01812 Encounter for preprocedural laboratory examination: Secondary | ICD-10-CM | POA: Insufficient documentation

## 2019-01-06 DIAGNOSIS — Z20822 Contact with and (suspected) exposure to covid-19: Secondary | ICD-10-CM | POA: Insufficient documentation

## 2019-01-06 DIAGNOSIS — M79641 Pain in right hand: Secondary | ICD-10-CM | POA: Diagnosis not present

## 2019-01-06 LAB — AEROBIC CULTURE? (SUPERFICIAL SPECIMEN)

## 2019-01-06 LAB — SARS CORONAVIRUS 2 (TAT 6-24 HRS): SARS Coronavirus 2: NEGATIVE

## 2019-01-06 LAB — AEROBIC CULTURE W GRAM STAIN (SUPERFICIAL SPECIMEN)

## 2019-01-06 NOTE — Progress Notes (Signed)
Pt aware to arrive at [facility] admitting at [place]. Pt verbalized understanding of no food or drink after midnight.

## 2019-01-07 ENCOUNTER — Ambulatory Visit (HOSPITAL_COMMUNITY): Payer: BC Managed Care – PPO | Admitting: Physician Assistant

## 2019-01-07 ENCOUNTER — Encounter (HOSPITAL_COMMUNITY): Payer: Self-pay | Admitting: Orthopedic Surgery

## 2019-01-07 ENCOUNTER — Encounter (HOSPITAL_COMMUNITY)
Admission: RE | Disposition: A | Discharge status: Home / Self Care | Payer: Self-pay | Source: Home / Self Care | Attending: Orthopedic Surgery

## 2019-01-07 ENCOUNTER — Ambulatory Visit (HOSPITAL_COMMUNITY)
Admission: RE | Admit: 2019-01-07 | Discharge: 2019-01-07 | Disposition: A | Discharge status: Home / Self Care | Payer: BC Managed Care – PPO | Attending: Orthopedic Surgery | Admitting: Orthopedic Surgery

## 2019-01-07 DIAGNOSIS — I10 Essential (primary) hypertension: Secondary | ICD-10-CM | POA: Diagnosis not present

## 2019-01-07 DIAGNOSIS — E119 Type 2 diabetes mellitus without complications: Secondary | ICD-10-CM | POA: Diagnosis not present

## 2019-01-07 DIAGNOSIS — L02511 Cutaneous abscess of right hand: Secondary | ICD-10-CM | POA: Insufficient documentation

## 2019-01-07 DIAGNOSIS — Z7902 Long term (current) use of antithrombotics/antiplatelets: Secondary | ICD-10-CM | POA: Diagnosis not present

## 2019-01-07 DIAGNOSIS — Z79899 Other long term (current) drug therapy: Secondary | ICD-10-CM | POA: Diagnosis not present

## 2019-01-07 DIAGNOSIS — Z8673 Personal history of transient ischemic attack (TIA), and cerebral infarction without residual deficits: Secondary | ICD-10-CM | POA: Diagnosis not present

## 2019-01-07 DIAGNOSIS — I639 Cerebral infarction, unspecified: Secondary | ICD-10-CM | POA: Diagnosis not present

## 2019-01-07 DIAGNOSIS — K219 Gastro-esophageal reflux disease without esophagitis: Secondary | ICD-10-CM | POA: Diagnosis not present

## 2019-01-07 DIAGNOSIS — Z794 Long term (current) use of insulin: Secondary | ICD-10-CM | POA: Insufficient documentation

## 2019-01-07 DIAGNOSIS — E785 Hyperlipidemia, unspecified: Secondary | ICD-10-CM | POA: Insufficient documentation

## 2019-01-07 DIAGNOSIS — L089 Local infection of the skin and subcutaneous tissue, unspecified: Secondary | ICD-10-CM

## 2019-01-07 DIAGNOSIS — L03113 Cellulitis of right upper limb: Secondary | ICD-10-CM | POA: Diagnosis not present

## 2019-01-07 HISTORY — PX: INCISION AND DRAINAGE ABSCESS: SHX5864

## 2019-01-07 LAB — GLUCOSE, CAPILLARY
Glucose-Capillary: 190 mg/dL — ABNORMAL HIGH (ref 70–99)
Glucose-Capillary: 178 mg/dL — ABNORMAL HIGH (ref 70–99)

## 2019-01-07 LAB — HEMOGLOBIN A1C
Hgb A1c MFr Bld: 8.6 % — ABNORMAL HIGH (ref 4.8–5.6)
Mean Plasma Glucose: 200.12 mg/dL

## 2019-01-07 SURGERY — INCISION AND DRAINAGE, ABSCESS
Anesthesia: General | Laterality: Right

## 2019-01-07 MED ORDER — BUPIVACAINE HCL (PF) 0.25 % IJ SOLN
INTRAMUSCULAR | Status: DC | PRN
  Administered 2019-01-07: 10 mL

## 2019-01-07 MED ORDER — BUPIVACAINE HCL (PF) 0.25 % IJ SOLN
INTRAMUSCULAR | Status: AC
  Filled 2019-01-07: qty 30

## 2019-01-07 MED ORDER — LACTATED RINGERS IV SOLN: INTRAVENOUS | Status: DC

## 2019-01-07 MED ORDER — CHLORHEXIDINE GLUCONATE 4 % EX LIQD
60.0000 mL | Freq: Once | CUTANEOUS | Status: AC
  Administered 2019-01-07: 4 via TOPICAL

## 2019-01-07 MED ORDER — SODIUM CHLORIDE 0.9 % IR SOLN
Status: DC | PRN
  Administered 2019-01-07: 1000 mL

## 2019-01-07 MED ORDER — LIDOCAINE 2% (20 MG/ML) 5 ML SYRINGE
INTRAMUSCULAR | Status: DC | PRN
  Administered 2019-01-07: 50 mg via INTRAVENOUS

## 2019-01-07 MED ORDER — PHENYLEPHRINE 40 MCG/ML (10ML) SYRINGE FOR IV PUSH (FOR BLOOD PRESSURE SUPPORT)
PREFILLED_SYRINGE | INTRAVENOUS | Status: DC | PRN
  Administered 2019-01-07: 80 ug via INTRAVENOUS

## 2019-01-07 MED ORDER — ONDANSETRON HCL 4 MG/2ML IJ SOLN: 4.0000 mg | Freq: Once | INTRAMUSCULAR | Status: DC | PRN

## 2019-01-07 MED ORDER — HYDROMORPHONE HCL 1 MG/ML IJ SOLN
0.5000 mg | INTRAMUSCULAR | Status: DC | PRN
  Administered 2019-01-07: 15:00:00 0.5 mg via INTRAVENOUS

## 2019-01-07 MED ORDER — ONDANSETRON HCL 4 MG PO TABS: 4.0000 mg | ORAL_TABLET | Freq: Three times a day (TID) | ORAL | 0 refills | Status: DC | PRN

## 2019-01-07 MED ORDER — CEFAZOLIN SODIUM-DEXTROSE 2-4 GM/100ML-% IV SOLN
2.0000 g | INTRAVENOUS | Status: AC
  Administered 2019-01-07: 11:00:00 2 g via INTRAVENOUS
  Filled 2019-01-07: qty 100

## 2019-01-07 MED ORDER — FENTANYL CITRATE (PF) 100 MCG/2ML IJ SOLN
INTRAMUSCULAR | Status: AC
  Filled 2019-01-07: qty 2

## 2019-01-07 MED ORDER — OXYCODONE HCL 5 MG PO TABS: 5.0000 mg | ORAL_TABLET | Freq: Three times a day (TID) | ORAL | 0 refills | Status: AC | PRN

## 2019-01-07 MED ORDER — FENTANYL CITRATE (PF) 100 MCG/2ML IJ SOLN
INTRAMUSCULAR | Status: DC | PRN
  Administered 2019-01-07 (×4): 25 ug via INTRAVENOUS

## 2019-01-07 MED ORDER — ENSURE PRE-SURGERY PO LIQD
296.0000 mL | Freq: Once | ORAL | Status: DC
  Filled 2019-01-07: qty 296

## 2019-01-07 MED ORDER — FENTANYL CITRATE (PF) 100 MCG/2ML IJ SOLN
25.0000 ug | INTRAMUSCULAR | Status: DC | PRN
  Administered 2019-01-07 (×2): 50 ug via INTRAVENOUS

## 2019-01-07 MED ORDER — ONDANSETRON HCL 4 MG/2ML IJ SOLN
INTRAMUSCULAR | Status: DC | PRN
  Administered 2019-01-07: 4 mg via INTRAVENOUS

## 2019-01-07 MED ORDER — HYDROMORPHONE HCL 1 MG/ML IJ SOLN
INTRAMUSCULAR | Status: AC
  Filled 2019-01-07: qty 1

## 2019-01-07 MED ORDER — PROPOFOL 10 MG/ML IV BOLUS
INTRAVENOUS | Status: AC
  Filled 2019-01-07: qty 20

## 2019-01-07 MED ORDER — PROPOFOL 10 MG/ML IV BOLUS
INTRAVENOUS | Status: DC | PRN
  Administered 2019-01-07: 180 mg via INTRAVENOUS

## 2019-01-07 MED ORDER — ACETAMINOPHEN 500 MG PO TABS
1000.0000 mg | ORAL_TABLET | Freq: Once | ORAL | Status: AC
  Administered 2019-01-07: 1000 mg via ORAL
  Filled 2019-01-07: qty 2

## 2019-01-07 MED FILL — ONDANSETRON HCL 4 MG TABLET: 4 | 7 days supply | Qty: 20 | Fill #0

## 2019-01-07 MED FILL — oxyCODONE HCL 5 MG TABS: 5 | 5 days supply | Qty: 15 | Fill #0

## 2019-01-07 SURGICAL SUPPLY — 74 items
BAG SPEC THK2 15X12 ZIP CLS (MISCELLANEOUS) ×1
BAG ZIPLOCK 12X15 (MISCELLANEOUS) ×2 IMPLANT
BLADE SURG 15 STRL LF DISP TIS (BLADE) ×1 IMPLANT
BLADE SURG 15 STRL SS (BLADE) ×2
BLADE SURG SZ10 CARB STEEL (BLADE) ×2 IMPLANT
BNDG CMPR 9X4 STRL LF SNTH (GAUZE/BANDAGES/DRESSINGS) ×1
BNDG ELASTIC 3X5.8 VLCR STR LF (GAUZE/BANDAGES/DRESSINGS) ×2 IMPLANT
BNDG ELASTIC 4X5.8 VLCR STR LF (GAUZE/BANDAGES/DRESSINGS) ×2 IMPLANT
BNDG ESMARK 4X9 LF (GAUZE/BANDAGES/DRESSINGS) ×2 IMPLANT
BNDG GAUZE ELAST 4 BULKY (GAUZE/BANDAGES/DRESSINGS) ×1 IMPLANT
BRUSH SCRUB EZ PLAIN DRY (MISCELLANEOUS) ×1 IMPLANT
CORD BIPOLAR FORCEPS 12FT (ELECTRODE) ×1 IMPLANT
COVER BACK TABLE 60X90IN (DRAPES) ×2 IMPLANT
COVER SURGICAL LIGHT HANDLE (MISCELLANEOUS) ×2 IMPLANT
COVER WAND RF STERILE (DRAPES) IMPLANT
CUFF TOURN SGL QUICK 18X4 (TOURNIQUET CUFF) ×2 IMPLANT
DECANTER SPIKE VIAL GLASS SM (MISCELLANEOUS) ×1 IMPLANT
DRAIN PENROSE 18X1/2 LTX STRL (DRAIN) ×1 IMPLANT
DRAPE EXTREMITY T 121X128X90 (DISPOSABLE) ×2 IMPLANT
DRAPE SHEET LG 3/4 BI-LAMINATE (DRAPES) ×1 IMPLANT
DRAPE SURG 17X11 SM STRL (DRAPES) ×4 IMPLANT
DRAPE SURG 17X23 STRL (DRAPES) ×2 IMPLANT
DRAPE TOP 10253 STERILE (DRAPES) ×1 IMPLANT
DRAPE U-SHAPE 47X51 STRL (DRAPES) ×1 IMPLANT
DRSG EMULSION OIL 3X3 NADH (GAUZE/BANDAGES/DRESSINGS) ×2 IMPLANT
DRSG PAD ABDOMINAL 8X10 ST (GAUZE/BANDAGES/DRESSINGS) ×2 IMPLANT
ELECT REM PT RETURN 15FT ADLT (MISCELLANEOUS) ×2 IMPLANT
GAUZE PACKING IODOFORM 1/4X15 (GAUZE/BANDAGES/DRESSINGS) ×1 IMPLANT
GAUZE SPONGE 4X4 12PLY STRL (GAUZE/BANDAGES/DRESSINGS) ×2 IMPLANT
GAUZE XEROFORM 1X8 LF (GAUZE/BANDAGES/DRESSINGS) ×1 IMPLANT
GLOVE BIO SURGEON STRL SZ7.5 (GLOVE) ×4 IMPLANT
GLOVE BIOGEL M STRL SZ7.5 (GLOVE) IMPLANT
GLOVE BIOGEL PI IND STRL 8 (GLOVE) ×1 IMPLANT
GLOVE BIOGEL PI INDICATOR 8 (GLOVE) ×1
GLOVE SURG ORTHO 8.0 STRL STRW (GLOVE) ×2 IMPLANT
GOWN STRL REUS W/ TWL LRG LVL3 (GOWN DISPOSABLE) ×1 IMPLANT
GOWN STRL REUS W/TWL LRG LVL3 (GOWN DISPOSABLE) ×2
GOWN STRL REUS W/TWL XL LVL3 (GOWN DISPOSABLE) ×2 IMPLANT
IV LACTATED RINGER IRRG 3000ML (IV SOLUTION)
IV LR IRRIG 3000ML ARTHROMATIC (IV SOLUTION) ×1 IMPLANT
KIT BASIN OR (CUSTOM PROCEDURE TRAY) ×2 IMPLANT
KIT TURNOVER KIT A (KITS) IMPLANT
MANIFOLD NEPTUNE II (INSTRUMENTS) ×2 IMPLANT
NDL HYPO 25X1 1.5 SAFETY (NEEDLE) ×1 IMPLANT
NEEDLE HYPO 25X1 1.5 SAFETY (NEEDLE) ×2 IMPLANT
NS IRRIG 1000ML POUR BTL (IV SOLUTION) ×2 IMPLANT
PACK ORTHO EXTREMITY (CUSTOM PROCEDURE TRAY) ×2 IMPLANT
PAD CAST 3X4 CTTN HI CHSV (CAST SUPPLIES) ×1 IMPLANT
PAD CAST 4YDX4 CTTN HI CHSV (CAST SUPPLIES) ×1 IMPLANT
PADDING CAST ABS 3INX4YD NS (CAST SUPPLIES) ×1
PADDING CAST ABS 4INX4YD NS (CAST SUPPLIES)
PADDING CAST ABS COTTON 3X4 (CAST SUPPLIES) ×1 IMPLANT
PADDING CAST ABS COTTON 4X4 ST (CAST SUPPLIES) ×1 IMPLANT
PADDING CAST COTTON 3X4 STRL (CAST SUPPLIES) ×2
PADDING CAST COTTON 4X4 STRL (CAST SUPPLIES)
PENCIL SMOKE EVACUATOR (MISCELLANEOUS) IMPLANT
PROTECTOR NERVE ULNAR (MISCELLANEOUS) ×2 IMPLANT
SOL PREP POV-IOD 4OZ 10% (MISCELLANEOUS) ×1 IMPLANT
SOL PREP PROV IODINE SCRUB 4OZ (MISCELLANEOUS) ×1 IMPLANT
STOCKINETTE 4X48 STRL (DRAPES) ×2 IMPLANT
SUT ETHILON 3 0 PS 1 (SUTURE) ×1 IMPLANT
SUT PROLENE 3 0 PS 2 (SUTURE) ×1 IMPLANT
SUT PROLENE 4 0 PS 2 18 (SUTURE) ×1 IMPLANT
SUT VIC AB 1 CT1 27 (SUTURE)
SUT VIC AB 1 CT1 27XBRD ANTBC (SUTURE) ×1 IMPLANT
SUT VIC AB 2-0 CT1 27 (SUTURE)
SUT VIC AB 2-0 CT1 27XBRD (SUTURE) ×1 IMPLANT
SYR 20ML LL LF (SYRINGE) ×2 IMPLANT
SYR BULB 3OZ (MISCELLANEOUS) ×2 IMPLANT
SYR CONTROL 10ML LL (SYRINGE) ×2 IMPLANT
TOWEL OR 17X26 10 PK STRL BLUE (TOWEL DISPOSABLE) ×2 IMPLANT
TRAY PREP A LATEX SAFE STRL (SET/KITS/TRAYS/PACK) ×1 IMPLANT
UNDERPAD 30X36 HEAVY ABSORB (UNDERPADS AND DIAPERS) ×2 IMPLANT
YANKAUER SUCT BULB TIP 10FT TU (MISCELLANEOUS) ×1 IMPLANT

## 2019-01-07 NOTE — Op Note (Signed)
01/07/2019  12:26 PM  PATIENT:  Emma Duran    PRE-OPERATIVE DIAGNOSIS:  RIGHT HAND ABSCESS  POST-OPERATIVE DIAGNOSIS:  Same  PROCEDURE:  INCISION AND DRAINAGE ABSCESS  SURGEON:  Renette Butters, MD  ASSISTANT: Roxan Hockey, PA-C, he was present and scrubbed throughout the case, critical for completion in a timely fashion, and for retraction, instrumentation, and closure.   ANESTHESIA:   gen  PREOPERATIVE INDICATIONS:  TACI DEBELL is a  64 y.o. female with a diagnosis of RIGHT HAND ABSCESS who failed conservative measures and elected for surgical management.    The risks benefits and alternatives were discussed with the patient preoperatively including but not limited to the risks of infection, bleeding, nerve injury, cardiopulmonary complications, the need for revision surgery, among others, and the patient was willing to proceed.  OPERATIVE IMPLANTS: drain  OPERATIVE FINDINGS: purulent fluid  BLOOD LOSS: min  COMPLICATIONS: none  TOURNIQUET TIME: none  OPERATIVE PROCEDURE:  Patient was identified in the preoperative holding area and site was marked by me She was transported to the operating theater and placed on the table in supine position taking care to pad all bony prominences. After a preincinduction time out anesthesia was induced. The right upper extremity was prepped and draped in normal sterile fashion and a pre-incision timeout was performed. She received ancef for preoperative antibiotics.   I made a longitudinal incision over her dorsal hand to include her draining site.  I used a hemostat to spread here into her abscess location there was a reaccumulation of some purulent fluid.  I debrided any none vitalized subcutaneous tissue with a rondure.  I performed a 3 tendon tenolysis cleaning these.  I thoroughly irrigated with a liter of saline.  I placed iodoform packing and closed her skin with a nylon stitch.  Sterile dressing was applied she was  awoken taken to PACU in stable condition  POST OPERATIVE PLAN: Mobilize for DVT prophylaxis iodoform packing removed on Friday

## 2019-01-07 NOTE — Discharge Instructions (Signed)
Diet: As you were doing prior to hospitalization   Shower:  May shower but keep the wounds dry, use an occlusive plastic wrap.  Dressing:  Keep dressings on and dry until follow up.  Activity:  Increase activity slowly as tolerated, but follow the weight bearing instructions below.  The rules on driving is that you can not be taking narcotics while you drive, and you must feel in control of the vehicle.    Weight Bearing:   No lifting right hand.  Keep hand elevated above heart at all times to reduce swelling and pain.  To prevent constipation: you may use a stool softener such as -  Colace (over the counter) 100 mg by mouth twice a day  Drink plenty of fluids (prune juice may be helpful) and high fiber foods Miralax (over the counter) for constipation as needed.    Itching:  If you experience itching with your medications, try taking only a single pain pill, or even half a pain pill at a time.  You can also use benadryl over the counter for itching or also to help with sleep.   Precautions:  If you experience chest pain or shortness of breath - call 911 immediately for transfer to the hospital emergency department!!  If you develop a fever greater that 101 F, purulent drainage from wound, increased redness or drainage from wound, or calf pain -- Call the office at [phone]                                                 Follow- Up Appointment:  Please call for an appointment to be seen this Friday A.M. 01/09/18  - [phone]

## 2019-01-07 NOTE — Transfer of Care (Signed)
Immediate Anesthesia Transfer of Care Note  Patient: Emma Duran  Procedure(s) Performed: INCISION AND DRAINAGE ABSCESS (Right )  Patient Location: PACU  Anesthesia Type:General  Level of Consciousness: awake, alert , oriented and patient cooperative  Airway & Oxygen Therapy: Patient Spontanous Breathing and Patient connected to face mask oxygen  Post-op Assessment: Report given to RN and Post -op Vital signs reviewed and stable  Post vital signs: Reviewed and stable  Last Vitals:  Vitals Value Taken Time  BP 153/88 01/07/19 1206  Temp    Pulse 67 01/07/19 1207  Resp 24 01/07/19 1207  SpO2 100 % 01/07/19 1207  Vitals shown include unvalidated device data.  Last Pain:  Vitals:   01/07/19 1013  PainSc: 5          Complications: No apparent anesthesia complications

## 2019-01-07 NOTE — H&P (Signed)
ORTHOPAEDIC CONSULTATION  REQUESTING PHYSICIAN: Renette Butters, MD  Chief Complaint: R dorsal hand abscess  HPI: Emma Duran is a 64 y.o. female who complains of cat scratch on 12/25. She failed outpatient antibiotics and developed a dorsal hand abscess. ED bedside drainage has helped but still has residual fluid collection.   Past Medical History:  Diagnosis Date  . Abnormal breast exam    chip in right breast as marker for abnormality   . Arthritis    knee, hands  . Diabetes mellitus    type 2  . External hemorrhoids   . GERD (gastroesophageal reflux disease)   . Heart murmur    never has caused any problems  . History of colon polyps   . History of kidney stones    surgery done  . Hyperlipidemia   . Hypertension   . Internal hemorrhoids   . Stroke [facility])    Past Surgical History:  Procedure Laterality Date  . ABDOMINAL HYSTERECTOMY    . BREAST LUMPECTOMY  09/28/10   right breast   . COLONOSCOPY    . ESOPHAGOGASTRODUODENOSCOPY    . [place]  2010  . KNEE ARTHROSCOPY WITH MEDIAL MENISECTOMY Right 09/30/2013   Procedure: Right knee arthroscopy partial meniscectomy, debridement;  Surgeon: Nita Sells, MD;  Location: [place];  Service: Orthopedics;  Laterality: Right;  Right knee arthroscopy partial menisectomy, debridement  . WISDOM TOOTH EXTRACTION     Social History   Socioeconomic History  . Marital status: Widowed    Spouse name: Not on file  . Number of children: 1  . Years of education: Not on file  . Highest education level: Not on file  Occupational History  . Occupation: OFFICE MANAGER    Employer: [employer]  Tobacco Use  . Smoking status: Never Smoker  . Smokeless tobacco: Never Used  Substance and Sexual Activity  . Alcohol use: No    Alcohol/week: 0.0 standard drinks  . Drug use: No  . Sexual activity: Yes    Birth control/protection: Post-menopausal, None    Comment:  hysterectomy  Other Topics Concern  . Not on file  Social History Narrative  . Not on file   Social Determinants of Health   Financial Resource Strain:   . Difficulty of Paying Living Expenses: Not on file  Food Insecurity:   . Worried About Charity fundraiser in the Last Year: Not on file  . Ran Out of Food in the Last Year: Not on file  Transportation Needs:   . Lack of Transportation (Medical): Not on file  . Lack of Transportation (Non-Medical): Not on file  Physical Activity:   . Days of Exercise per Week: Not on file  . Minutes of Exercise per Session: Not on file  Stress:   . Feeling of Stress : Not on file  Social Connections:   . Frequency of Communication with Friends and Family: Not on file  . Frequency of Social Gatherings with Friends and Family: Not on file  . Attends Religious Services: Not on file  . Active Member of Clubs or Organizations: Not on file  . Attends Archivist Meetings: Not on file  . Marital Status: Not on file   Family History  Problem Relation Age of Onset  . Diabetes Mother   . Lung cancer Father   . Cancer Father 49       lung cancer   . Cancer Paternal Grandfather   .  Lung cancer Paternal Grandmother   . Hypertension Other   . Colon cancer Neg Hx   . Stomach cancer Neg Hx   . Esophageal cancer Neg Hx   . Colon polyps Neg Hx   . Rectal cancer Neg Hx    Allergies  Allergen Reactions  . Omeprazole Hives and Itching    Full body hives and itching   . Percocet [Oxycodone-Acetaminophen] Other (See Comments)    Patient states "Pain med doesn't work"  . Tramadol     Ineffective   . Morphine Anxiety    Hallucinations    Prior to Admission medications   Medication Sig Start Date End Date Taking? Authorizing Provider  acetaminophen (TYLENOL) 500 MG tablet Take 1,000 mg by mouth every 8 (eight) hours as needed for moderate pain.   Yes [provider]  cephALEXin (KEFLEX) 500 MG capsule Take 1 capsule (500 mg total)  by mouth 4 (four) times daily for 10 days. 01/05/19 01/15/19 Yes Fransico Meadow, PA-C  Cholecalciferol (VITAMIN D) 50 MCG (2000 UT) tablet Take 2,000 Units by mouth daily.   Yes [provider]  clopidogrel (PLAVIX) 75 MG tablet Take 1 tablet (75 mg total) by mouth daily. Patient taking differently: Take 75 mg by mouth daily.  07/03/17  Yes McCue, Janett Billow, NP  DILT-XR 240 MG 24 hr capsule Take 240 mg by mouth at bedtime.  12/17/18  Yes [provider]  doxycycline (VIBRA-TABS) 100 MG tablet Take 1 tablet (100 mg total) by mouth 2 (two) times daily. 01/05/19  Yes Caryl Ada K, PA-C  ferrous sulfate 325 (65 FE) MG tablet Take 325 mg by mouth daily.   Yes [provider]  Magnesium 200 MG TABS Take 200 mg by mouth daily.   Yes [provider]  Potassium Bicarbonate 99 MG CAPS Take 99 mg by mouth daily.   Yes [provider]  pravastatin (PRAVACHOL) 40 MG tablet Take 40 mg by mouth daily.  10/08/18  Yes [provider]  TRESIBA FLEXTOUCH 100 UNIT/ML SOPN FlexTouch Pen Inject 56 Units as directed daily.  03/26/17  Yes [provider]   No results found.  Positive ROS: All other systems have been reviewed and were otherwise negative with the exception of those mentioned in the HPI and as above.  Labs cbc Recent Labs    01/05/19 1354  WBC 8.7  HGB 10.6*  HCT 33.1*  PLT 350    Labs inflam No results for input(s): CRP in the last 72 hours.  Invalid input(s): ESR  Labs coag No results for input(s): INR, PTT in the last 72 hours.  Invalid input(s): PT  Recent Labs    01/05/19 1354  NA 139  K 3.6  CL 106  CO2 24  GLUCOSE 245*  BUN 15  CREATININE 1.47*  CALCIUM 9.1    Physical Exam: There were no vitals filed for this visit. General: Alert, no acute distress Cardiovascular: No pedal edema Respiratory: No cyanosis, no use of accessory musculature GI: No organomegaly, abdomen is soft and non-tender Skin: No lesions in  the area of chief complaint other than those listed below in MSK exam.  Neurologic: Sensation intact distally save for the below mentioned MSK exam Psychiatric: Patient is competent for consent with normal mood and affect Lymphatic: No axillary or cervical lymphadenopathy  MUSCULOSKELETAL:  RUE: NVI, purulent drainage dorsally. Fluctuanse, soft compartments Other extremities are atraumatic with painless ROM and NVI.  Assessment: R dorsal hand abscess  Plan: I  discussed risks and benefits of I&D with tenolysis and she woul dlike to go forward with that.    Renette Butters, MD    01/07/2019 9:41 AM

## 2019-01-07 NOTE — Anesthesia Postprocedure Evaluation (Signed)
Anesthesia Post Note  Patient: Emma Duran  Procedure(s) Performed: INCISION AND DRAINAGE ABSCESS (Right )     Patient location during evaluation: PACU Anesthesia Type: General Level of consciousness: awake and alert and oriented Pain management: pain level controlled Vital Signs Assessment: post-procedure vital signs reviewed and stable Respiratory status: spontaneous breathing, nonlabored ventilation and respiratory function stable Cardiovascular status: blood pressure returned to baseline and stable Postop Assessment: no apparent nausea or vomiting Anesthetic complications: no    Last Vitals:  Vitals:   01/07/19 1245 01/07/19 1300  BP: (!) 185/71 (!) 196/88  Pulse: 61 67  Resp: 12 13  Temp:    SpO2: 100% 93%    Last Pain:  Vitals:   01/07/19 1300  PainSc: 3                  [name]

## 2019-01-07 NOTE — Anesthesia Procedure Notes (Signed)
Procedure Name: LMA Insertion Date/Time: 01/07/2019 11:09 AM Performed by: Anne Fu, CRNA Pre-anesthesia Checklist: Patient identified, Emergency Drugs available, Suction available, Patient being monitored and Timeout performed Patient Re-evaluated:Patient Re-evaluated prior to induction Oxygen Delivery Method: Circle system utilized Preoxygenation: Pre-oxygenation with 100% oxygen Induction Type: IV induction Ventilation: Mask ventilation without difficulty LMA: LMA inserted LMA Size: 4.0 Number of attempts: 1 Placement Confirmation: positive ETCO2 and breath sounds checked- equal and bilateral Tube secured with: Tape

## 2019-01-07 NOTE — Interval H&P Note (Signed)
I participated in the care of this patient and agree with the above history, physical and evaluation. I performed a review of the history and a physical exam as detailed   Wirt Hemmerich Daniel Panagiota Perfetti MD  

## 2019-01-07 NOTE — Anesthesia Preprocedure Evaluation (Signed)
Anesthesia Evaluation  Patient identified by MRN, date of birth, ID band Patient awake    Reviewed: Allergy & Precautions, NPO status , Patient's Chart, lab work & pertinent test results  Airway Mallampati: II  TM Distance: >3 FB Neck ROM: Full    Dental no notable dental hx. (+) Teeth Intact   Pulmonary neg pulmonary ROS,    Pulmonary exam normal breath sounds clear to auscultation       Cardiovascular hypertension, Pt. on medications Normal cardiovascular exam+ Valvular Problems/Murmurs  Rhythm:Regular Rate:Normal     Neuro/Psych CVA, No Residual Symptoms negative psych ROS   GI/Hepatic Neg liver ROS, GERD  Medicated and Controlled,  Endo/Other  diabetes, Well Controlled, Type 2Hyyperlipidemia  Renal/GU negative Renal ROS  negative genitourinary   Musculoskeletal  (+) Arthritis , Osteoarthritis,  OA hands and feet Right hand abscess   Abdominal   Peds  Hematology Plavix therapy- last dose   Anesthesia Other Findings   Reproductive/Obstetrics                             Anesthesia Physical Anesthesia Plan  ASA: III  Anesthesia Plan: General   Post-op Pain Management:    Induction: Intravenous  PONV Risk Score and Plan: 4 or greater and Ondansetron, Treatment may vary due to age or medical condition and Midazolam  Airway Management Planned: LMA  Additional Equipment:   Intra-op Plan:   Post-operative Plan: Extubation in OR  Informed Consent: I have reviewed the patients History and Physical, chart, labs and discussed the procedure including the risks, benefits and alternatives for the proposed anesthesia with the patient or authorized representative who has indicated his/her understanding and acceptance.     Dental advisory given  Plan Discussed with: CRNA and Surgeon  Anesthesia Plan Comments:         Anesthesia Quick Evaluation

## 2019-01-08 ENCOUNTER — Telehealth: Payer: Self-pay | Admitting: Emergency Medicine

## 2019-01-08 MED FILL — PRAVASTATIN NA 40 MG TAB: 40 | 90 days supply | Qty: 90 | Fill #0

## 2019-01-08 NOTE — Telephone Encounter (Signed)
Post ED Visit - Positive Culture Follow-up  Culture report reviewed by antimicrobial stewardship pharmacist: [place] Team []  Elenor Quinones, Pharm.D. []  Heide Guile, Pharm.D., BCPS AQ-ID []  Parks Neptune, Pharm.D., BCPS []  Alycia Rossetti, Pharm.D., BCPS []  [place], Pharm.D., BCPS, AAHIVP []  Legrand Como, Pharm.D., BCPS, AAHIVP []  Salome Arnt, PharmD, BCPS []  Johnnette Gourd, PharmD, BCPS []  Hughes Better, PharmD, BCPS []  Leeroy Cha, PharmD []  Laqueta Linden, PharmD, BCPS []  Albertina Parr, PharmD  [place] Team []  Leodis Sias, PharmD []  Lindell Spar, PharmD []  Royetta Asal, PharmD []  Graylin Shiver, Rph []  Rema Fendt) Glennon Mac, PharmD []  Arlyn Dunning, PharmD []  Netta Cedars, PharmD []  Dia Sitter, PharmD []  Leone Haven, PharmD []  Gretta Arab, PharmD []  Theodis Shove, PharmD []  Peggyann Juba, PharmD []  Reuel Boom, PharmD   Positive wound culture Treated with amoxicillin and cephalexin and doxycyline, organism sensitive to the same and no further patient follow-up is required at this time.  Hazle Nordmann 01/08/2019, 5:25 PM

## 2019-01-10 DIAGNOSIS — L03113 Cellulitis of right upper limb: Secondary | ICD-10-CM | POA: Diagnosis not present

## 2019-01-10 LAB — CULTURE, BLOOD (ROUTINE X 2)
Culture: NO GROWTH
Culture: NO GROWTH
Special Requests: ADEQUATE
Special Requests: ADEQUATE

## 2019-01-15 DIAGNOSIS — L03113 Cellulitis of right upper limb: Secondary | ICD-10-CM | POA: Diagnosis not present

## 2019-01-28 MED FILL — DILT-XR 240 MG CAP SA: 240 | 30 days supply | Qty: 30 | Fill #1

## 2019-02-12 DIAGNOSIS — D631 Anemia in chronic kidney disease: Secondary | ICD-10-CM | POA: Diagnosis not present

## 2019-02-12 DIAGNOSIS — N179 Acute kidney failure, unspecified: Secondary | ICD-10-CM | POA: Diagnosis not present

## 2019-02-12 DIAGNOSIS — N183 Chronic kidney disease, stage 3 unspecified: Secondary | ICD-10-CM | POA: Diagnosis not present

## 2019-02-12 DIAGNOSIS — I129 Hypertensive chronic kidney disease with stage 1 through stage 4 chronic kidney disease, or unspecified chronic kidney disease: Secondary | ICD-10-CM | POA: Diagnosis not present

## 2019-02-13 MED FILL — LISINOPRIL 5 MG TABS: 5 | 30 days supply | Qty: 30 | Fill #0

## 2019-02-19 MED FILL — CLOPIDOGREL 75 MG TABLET: 75 | 90 days supply | Qty: 90 | Fill #1

## 2019-02-25 MED FILL — DILT-XR 240 MG CAP SA: 240 | 30 days supply | Qty: 30 | Fill #2

## 2019-03-13 DIAGNOSIS — N183 Chronic kidney disease, stage 3 unspecified: Secondary | ICD-10-CM | POA: Diagnosis not present

## 2019-03-14 MED FILL — LOSARTAN POTASSIUM 25 MG TA: 25 | 30 days supply | Qty: 30 | Fill #0

## 2019-03-27 MED FILL — DILT-XR 240 MG CAP SA: 240 | 30 days supply | Qty: 30 | Fill #3

## 2019-03-31 DIAGNOSIS — M222X1 Patellofemoral disorders, right knee: Secondary | ICD-10-CM | POA: Diagnosis not present

## 2019-03-31 DIAGNOSIS — M222X2 Patellofemoral disorders, left knee: Secondary | ICD-10-CM | POA: Diagnosis not present

## 2019-04-09 MED FILL — LOSARTAN POTASSIUM 25 MG TA: 25 | 30 days supply | Qty: 30 | Fill #1

## 2019-04-09 MED FILL — PRAVASTATIN NA 40 MG TAB: 40 | 90 days supply | Qty: 90 | Fill #1

## 2019-04-21 DIAGNOSIS — E1169 Type 2 diabetes mellitus with other specified complication: Secondary | ICD-10-CM | POA: Diagnosis not present

## 2019-04-21 DIAGNOSIS — E7800 Pure hypercholesterolemia, unspecified: Secondary | ICD-10-CM | POA: Diagnosis not present

## 2019-04-21 DIAGNOSIS — Z0000 Encounter for general adult medical examination without abnormal findings: Secondary | ICD-10-CM | POA: Diagnosis not present

## 2019-04-22 MED FILL — TRUEplus 5-BEVEL PEN NEEDLE: 32G X 4 MM | 90 days supply | Qty: 100 | Fill #0

## 2019-04-28 MED FILL — DILT-XR 240 MG CAP SA: 240 | 30 days supply | Qty: 30 | Fill #0

## 2019-05-21 MED FILL — CLOPIDOGREL 75 MG TABLET: 75 | 90 days supply | Qty: 90 | Fill #2

## 2019-05-28 MED FILL — PRAVASTATIN SODIUM 80 MG TA: 80 | 90 days supply | Qty: 90 | Fill #0

## 2019-05-28 MED FILL — DILT-XR 240 MG CAP SA: 240 | 30 days supply | Qty: 30 | Fill #1

## 2019-06-26 MED FILL — DILT-XR 240 MG CAP SA: 240 | 30 days supply | Qty: 30 | Fill #2

## 2019-07-10 MED FILL — LOSARTAN POTASSIUM 25 MG TA: 25 | 30 days supply | Qty: 30 | Fill #4

## 2019-07-24 MED FILL — DILT-XR 240 MG CAP SA: 240 | 30 days supply | Qty: 30 | Fill #0

## 2019-08-11 ENCOUNTER — Other Ambulatory Visit: Payer: Self-pay

## 2019-08-11 ENCOUNTER — Ambulatory Visit: Payer: BC Managed Care – PPO | Admitting: Podiatry

## 2019-08-11 ENCOUNTER — Ambulatory Visit (INDEPENDENT_AMBULATORY_CARE_PROVIDER_SITE_OTHER): Payer: Medicare Other

## 2019-08-11 DIAGNOSIS — M722 Plantar fascial fibromatosis: Secondary | ICD-10-CM

## 2019-08-11 MED ORDER — MELOXICAM 15 MG PO TABS: 15.0000 mg | ORAL_TABLET | Freq: Every day | ORAL | 1 refills | Status: DC

## 2019-08-11 MED FILL — LOSARTAN POTASSIUM 25 MG TA: 25 | 30 days supply | Qty: 30 | Fill #5

## 2019-08-11 MED FILL — MELOXICAM 15 MG TABLET: 15 | 30 days supply | Qty: 30 | Fill #0

## 2019-08-11 NOTE — Progress Notes (Signed)
   Subjective: 64 y.o. female presenting as a new patient for evaluation of bilateral heel pain.  Patient was referred by her PCP, Dr. Brigitte Pulse.  Patient states that she has been experiencing heel pain for the past 3-4 months.  She does not recall injury or anything that would have aggravated it.  The pain has now been constant.  The heels hurt after walking for long periods of time.  She has been soaking her feet and trying to reduce her activity.  No new complaints.   Past Medical History:  Diagnosis Date  . Abnormal breast exam    chip in right breast as marker for abnormality   . Arthritis    knee, hands  . Diabetes mellitus    type 2  . External hemorrhoids   . GERD (gastroesophageal reflux disease)   . Heart murmur    never has caused any problems  . History of colon polyps   . History of kidney stones    surgery done  . Hyperlipidemia   . Hypertension   . Internal hemorrhoids   . Stroke [facility])      Objective: Physical Exam General: The patient is alert and oriented x3 in no acute distress.  Dermatology: Skin is warm, dry and supple bilateral lower extremities. Negative for open lesions or macerations bilateral.   Vascular: Dorsalis Pedis and Posterior Tibial pulses palpable bilateral.  Capillary fill time is immediate to all digits.  Patient does have some chronic lower extremity edema that is intermittent.  She wears below-knee compression socks.  Neurological: Epicritic and protective threshold intact bilateral.   Musculoskeletal: Tenderness to palpation to the plantar aspect of the bilateral heels along the plantar fascia. All other joints range of motion within normal limits bilateral. Strength 5/5 in all groups bilateral.   Radiographic exam: Normal osseous mineralization. Joint spaces preserved. No fracture/dislocation/boney destruction. No other soft tissue abnormalities or radiopaque foreign bodies.   Assessment: 1. plantar fasciitis bilateral feet  Plan of  Care:  1. Patient evaluated. Xrays reviewed.   2. Injection of 0.5cc Celestone soluspan injected into the bilateral heels.  3. Rx for Meloxicam ordered for patient. 4.  Instructed patient regarding therapies and modalities at home to alleviate symptoms.  5. Return to clinic in 4 weeks.    Edrick Kins, DPM [facility]  Dr. Edrick Kins, DPM    [address], [place] [address]                Office [phone]  Fax [phone]

## 2019-08-20 ENCOUNTER — Other Ambulatory Visit (HOSPITAL_COMMUNITY): Payer: Self-pay | Admitting: Family Medicine

## 2019-08-20 MED FILL — TRESIBA FLEXTOUCH 200 UNITS: 200 | 30 days supply | Qty: 18 | Fill #0

## 2019-08-20 MED FILL — CLOPIDOGREL 75 MG TABLET: 75 | 90 days supply | Qty: 90 | Fill #3

## 2019-08-21 ENCOUNTER — Other Ambulatory Visit (HOSPITAL_COMMUNITY): Payer: Self-pay | Admitting: Family Medicine

## 2019-08-21 MED FILL — ACCU-CHEK GUIDE STRP: 50 days supply | Qty: 100 | Fill #0

## 2019-08-21 MED FILL — ACCU-CHEK GUIDE ME W/DEVICE: W/DEVICE | 30 days supply | Qty: 1 | Fill #0

## 2019-08-25 MED FILL — ACCU-CHEK SOFTCLIX LANCETS: 50 days supply | Qty: 100 | Fill #0

## 2019-08-26 ENCOUNTER — Other Ambulatory Visit (HOSPITAL_COMMUNITY): Payer: Self-pay | Admitting: Family Medicine

## 2019-08-26 MED FILL — PRAVASTATIN SODIUM 80 MG TA: 80 | 90 days supply | Qty: 90 | Fill #0

## 2019-08-26 MED FILL — DILT-XR 240 MG CAP SA: 240 | 30 days supply | Qty: 30 | Fill #1

## 2019-09-09 MED FILL — LOSARTAN POTASSIUM 25 MG TA: 25 | 30 days supply | Qty: 30 | Fill #6

## 2019-09-09 MED FILL — MELOXICAM 15 MG TABLET: 15 | 30 days supply | Qty: 30 | Fill #1

## 2019-09-10 ENCOUNTER — Ambulatory Visit: Payer: Medicare Other | Admitting: Podiatry

## 2019-09-10 ENCOUNTER — Other Ambulatory Visit: Payer: Self-pay

## 2019-09-10 DIAGNOSIS — M722 Plantar fascial fibromatosis: Secondary | ICD-10-CM | POA: Diagnosis not present

## 2019-09-10 NOTE — Progress Notes (Signed)
   Subjective: 64 y.o. female presenting for follow-up evaluation of bilateral plantar fasciitis.  Overall the patient states that she is doing much better.  The pain is very tolerable and she states that the injections in the meloxicam helped significantly.  No new complaints at this time.  Past Medical History:  Diagnosis Date  . Abnormal breast exam    chip in right breast as marker for abnormality   . Arthritis    knee, hands  . Diabetes mellitus    type 2  . External hemorrhoids   . GERD (gastroesophageal reflux disease)   . Heart murmur    never has caused any problems  . History of colon polyps   . History of kidney stones    surgery done  . Hyperlipidemia   . Hypertension   . Internal hemorrhoids   . Stroke [facility])      Objective: Physical Exam General: The patient is alert and oriented x3 in no acute distress.  Dermatology: Skin is warm, dry and supple bilateral lower extremities. Negative for open lesions or macerations bilateral.   Vascular: Dorsalis Pedis and Posterior Tibial pulses palpable bilateral.  Capillary fill time is immediate to all digits.  Patient does have some chronic lower extremity edema that is intermittent.  She wears below-knee compression socks.  Neurological: Epicritic and protective threshold intact bilateral.   Musculoskeletal: Minimal tenderness to palpation to the plantar aspect of the bilateral heels along the plantar fascia. All other joints range of motion within normal limits bilateral. Strength 5/5 in all groups bilateral.   Assessment: 1. plantar fasciitis bilateral feet  Plan of Care:  1. Patient evaluated.   2. Injection of 0.5cc Celestone soluspan injected into the bilateral heels.  3.  At this point recommend that the patient slowly wean off of the meloxicam due to a history of renal problems 4.  Recommend good supportive shoes daily 5.  Return to clinic as needed  Edrick Kins, DPM [facility]  Dr. Edrick Kins, DPM    [address], [place] [address]                Office [phone]  Fax [phone]

## 2019-09-29 ENCOUNTER — Other Ambulatory Visit (HOSPITAL_COMMUNITY): Payer: Self-pay | Admitting: Nephrology

## 2019-09-29 MED FILL — DILT-XR 240 MG CAP SA: 240 | 30 days supply | Qty: 30 | Fill #0

## 2019-10-01 MED FILL — TRESIBA FLEXTOUCH 200 UNITS: 200 | 30 days supply | Qty: 18 | Fill #1

## 2019-10-01 MED FILL — ACCU-CHEK GUIDE STRP: 50 days supply | Qty: 100 | Fill #1

## 2019-10-02 MED FILL — LOSARTAN POTASSIUM 25 MG TA: 25 | 30 days supply | Qty: 30 | Fill #0

## 2019-10-27 MED FILL — DILT-XR 240 MG CAP SA: 240 | 30 days supply | Qty: 30 | Fill #0

## 2019-11-07 MED FILL — TRESIBA FLEXTOUCH 200 UNITS: 200 | 30 days supply | Qty: 18 | Fill #2

## 2019-11-07 MED FILL — LOSARTAN POTASSIUM 25 MG TA: 25 | 30 days supply | Qty: 30 | Fill #1

## 2019-11-17 ENCOUNTER — Other Ambulatory Visit (HOSPITAL_COMMUNITY): Payer: Self-pay | Admitting: Family Medicine

## 2019-11-17 MED FILL — CLOPIDOGREL 75 MG TABLET: 75 | 90 days supply | Qty: 90 | Fill #0

## 2019-11-24 MED FILL — DILT-XR 240 MG CAP SA: 240 | 30 days supply | Qty: 30 | Fill #1

## 2019-11-24 MED FILL — PRAVASTATIN SODIUM 80 MG TA: 80 | 90 days supply | Qty: 90 | Fill #1

## 2019-12-11 MED FILL — ACCU-CHEK SOFTCLIX LANCETS: 50 days supply | Qty: 100 | Fill #1

## 2019-12-11 MED FILL — TRESIBA FLEXTOUCH 200 UNITS: 200 | 30 days supply | Qty: 18 | Fill #3

## 2019-12-11 MED FILL — LOSARTAN POTASSIUM 25 MG TA: 25 | 30 days supply | Qty: 30 | Fill #2

## 2019-12-24 MED FILL — DILT-XR 240 MG CAP SA: 240 | 30 days supply | Qty: 30 | Fill #2

## 2020-01-08 MED FILL — LOSARTAN POTASSIUM 25 MG TA: 25 | 30 days supply | Qty: 30 | Fill #3

## 2020-01-08 MED FILL — ACCU-CHEK GUIDE STRP: 50 days supply | Qty: 100 | Fill #2

## 2020-01-09 DIAGNOSIS — E119 Type 2 diabetes mellitus without complications: Secondary | ICD-10-CM | POA: Diagnosis not present

## 2020-01-09 DIAGNOSIS — H2513 Age-related nuclear cataract, bilateral: Secondary | ICD-10-CM | POA: Diagnosis not present

## 2020-01-09 DIAGNOSIS — I10 Essential (primary) hypertension: Secondary | ICD-10-CM | POA: Diagnosis not present

## 2020-01-09 DIAGNOSIS — H5213 Myopia, bilateral: Secondary | ICD-10-CM | POA: Diagnosis not present

## 2020-01-09 DIAGNOSIS — H524 Presbyopia: Secondary | ICD-10-CM | POA: Diagnosis not present

## 2020-01-09 DIAGNOSIS — H35033 Hypertensive retinopathy, bilateral: Secondary | ICD-10-CM | POA: Diagnosis not present

## 2020-01-09 DIAGNOSIS — H52223 Regular astigmatism, bilateral: Secondary | ICD-10-CM | POA: Diagnosis not present

## 2020-01-20 MED FILL — TRESIBA FLEXTOUCH 200 UNITS: 200 | 30 days supply | Qty: 18 | Fill #4

## 2020-01-27 DIAGNOSIS — R809 Proteinuria, unspecified: Secondary | ICD-10-CM | POA: Diagnosis not present

## 2020-01-27 DIAGNOSIS — E1169 Type 2 diabetes mellitus with other specified complication: Secondary | ICD-10-CM | POA: Diagnosis not present

## 2020-01-27 DIAGNOSIS — I10 Essential (primary) hypertension: Secondary | ICD-10-CM | POA: Diagnosis not present

## 2020-01-27 DIAGNOSIS — E7800 Pure hypercholesterolemia, unspecified: Secondary | ICD-10-CM | POA: Diagnosis not present

## 2020-01-27 MED FILL — DILT-XR 240 MG CAP SA: 240 | 30 days supply | Qty: 30 | Fill #3

## 2020-02-06 MED FILL — ACCU-CHEK SOFTCLIX LANCETS: 50 days supply | Qty: 100 | Fill #2

## 2020-02-06 MED FILL — LOSARTAN POTASSIUM 25 MG TA: 25 | 30 days supply | Qty: 30 | Fill #4

## 2020-02-11 DIAGNOSIS — E1122 Type 2 diabetes mellitus with diabetic chronic kidney disease: Secondary | ICD-10-CM | POA: Diagnosis not present

## 2020-02-11 DIAGNOSIS — N179 Acute kidney failure, unspecified: Secondary | ICD-10-CM | POA: Diagnosis not present

## 2020-02-11 DIAGNOSIS — I639 Cerebral infarction, unspecified: Secondary | ICD-10-CM | POA: Diagnosis not present

## 2020-02-11 DIAGNOSIS — D631 Anemia in chronic kidney disease: Secondary | ICD-10-CM | POA: Diagnosis not present

## 2020-02-11 DIAGNOSIS — N183 Chronic kidney disease, stage 3 unspecified: Secondary | ICD-10-CM | POA: Diagnosis not present

## 2020-02-11 DIAGNOSIS — M1990 Unspecified osteoarthritis, unspecified site: Secondary | ICD-10-CM | POA: Diagnosis not present

## 2020-02-11 DIAGNOSIS — I129 Hypertensive chronic kidney disease with stage 1 through stage 4 chronic kidney disease, or unspecified chronic kidney disease: Secondary | ICD-10-CM | POA: Diagnosis not present

## 2020-02-16 MED FILL — CLOPIDOGREL 75 MG TABLET: 75 | 90 days supply | Qty: 90 | Fill #1

## 2020-02-24 MED FILL — TRESIBA FLEXTOUCH 200 UNITS: 200 | 30 days supply | Qty: 18 | Fill #5

## 2020-02-24 MED FILL — DILT-XR 240 MG CAP SA: 240 | 30 days supply | Qty: 30 | Fill #4

## 2020-02-24 MED FILL — PRAVASTATIN SODIUM 80 MG TA: 80 | 90 days supply | Qty: 90 | Fill #2

## 2020-03-08 MED FILL — LOSARTAN POTASSIUM 25 MG TA: 25 | 30 days supply | Qty: 30 | Fill #5

## 2020-03-24 MED FILL — DILT-XR 240 MG CAP SA: 240 | 30 days supply | Qty: 30 | Fill #5

## 2020-03-30 ENCOUNTER — Other Ambulatory Visit (HOSPITAL_COMMUNITY): Payer: Self-pay | Admitting: Family Medicine

## 2020-03-30 MED FILL — TRESIBA FLEXTOUCH 200 UNITS: 200 | 30 days supply | Qty: 18 | Fill #0

## 2020-04-05 DIAGNOSIS — C44319 Basal cell carcinoma of skin of other parts of face: Secondary | ICD-10-CM | POA: Diagnosis not present

## 2020-04-07 ENCOUNTER — Other Ambulatory Visit (HOSPITAL_COMMUNITY): Payer: Self-pay | Admitting: Nephrology

## 2020-04-08 ENCOUNTER — Other Ambulatory Visit (HOSPITAL_COMMUNITY): Payer: Self-pay

## 2020-04-09 ENCOUNTER — Other Ambulatory Visit (HOSPITAL_COMMUNITY): Payer: Self-pay

## 2020-04-09 ENCOUNTER — Other Ambulatory Visit (HOSPITAL_COMMUNITY): Payer: Self-pay | Admitting: Nephrology

## 2020-04-09 MED ORDER — LOSARTAN POTASSIUM 25 MG PO TABS
25.0000 mg | ORAL_TABLET | Freq: Every day | ORAL | 5 refills | Status: DC
  Filled 2020-04-09: qty 30, 30d supply, fill #0
  Filled 2020-05-06: qty 30, 30d supply, fill #1
  Filled 2020-06-03: qty 30, 30d supply, fill #2
  Filled 2020-07-06: qty 30, 30d supply, fill #3
  Filled 2020-08-04: qty 30, 30d supply, fill #4
  Filled 2020-08-31: qty 30, 30d supply, fill #5

## 2020-04-22 MED FILL — Diltiazem HCl Cap ER 24HR 240 MG: ORAL | 30 days supply | Qty: 30 | Fill #0 | Status: AC

## 2020-04-23 ENCOUNTER — Other Ambulatory Visit (HOSPITAL_COMMUNITY): Payer: Self-pay

## 2020-04-26 IMAGING — US US RENAL
1 series · 14 of 25 positions shown · non-contrast
Comparison: Ultrasound August 08, 2012.

CLINICAL DATA: Acute renal failure.

EXAM:
RENAL / URINARY TRACT ULTRASOUND COMPLETE

[Series 1: us renal · 0.22mm/px · 14 of 33 slices shown]
[im 1/33]
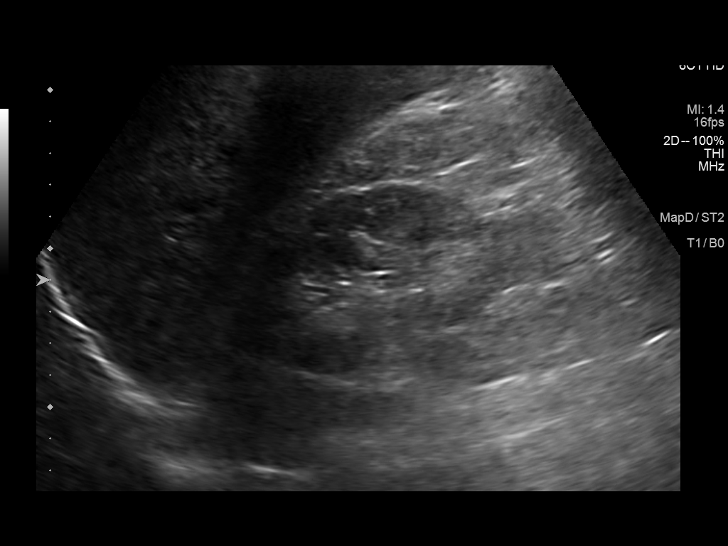
[im 3/33]
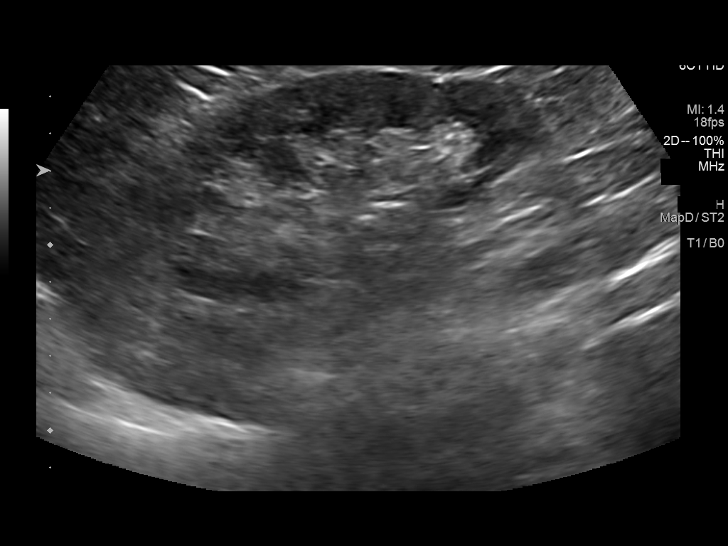
[im 6/33]
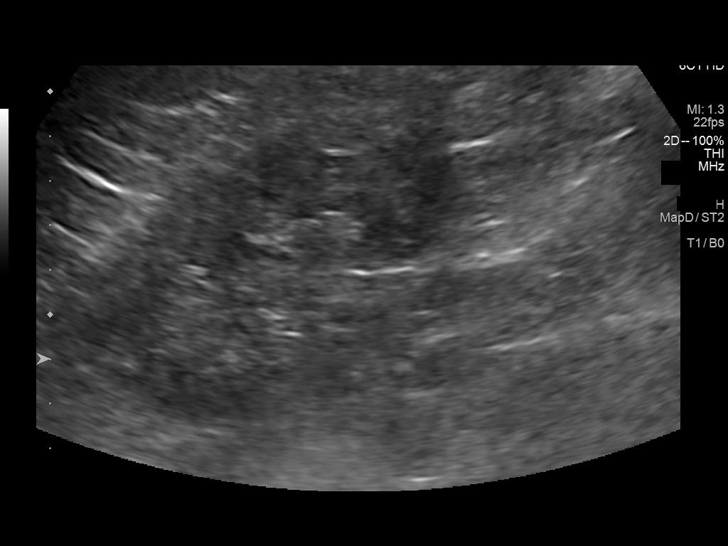
[im 9/33]
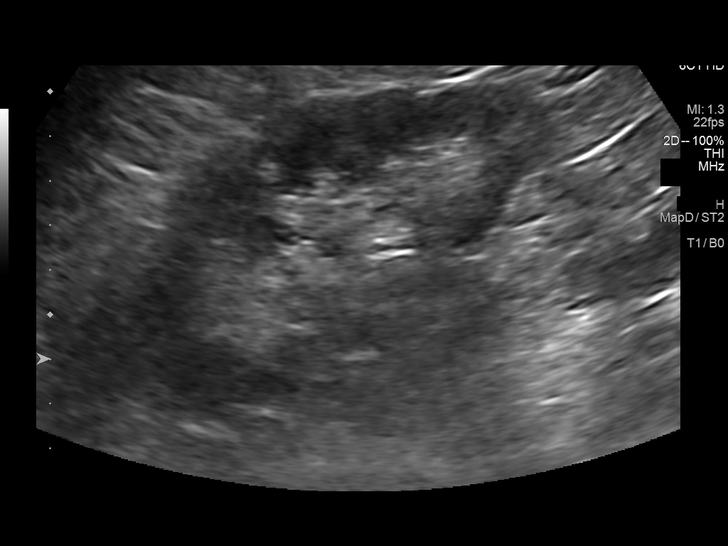
[im 11/33]
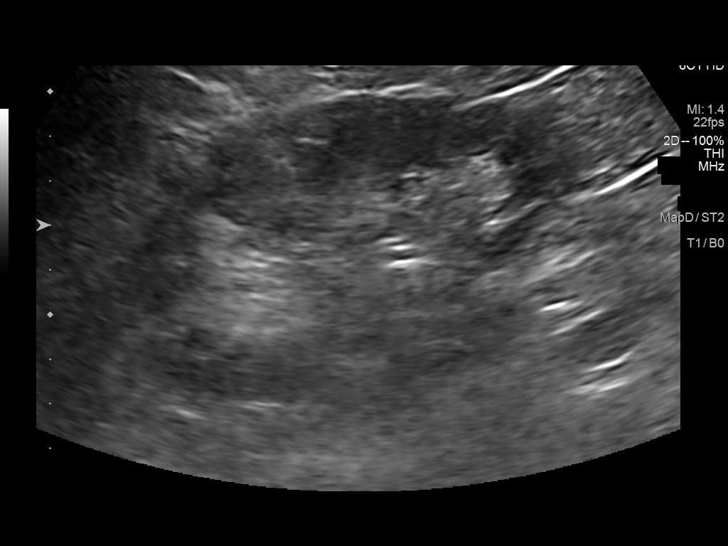
[im 13/33]
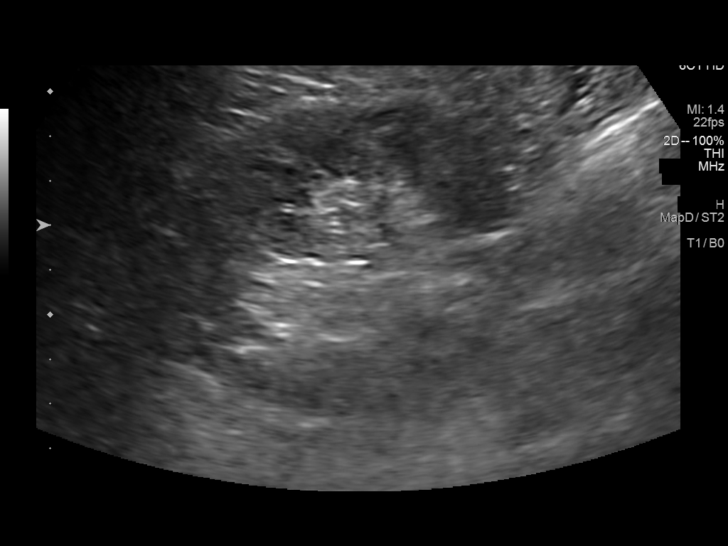
[im 15/33]
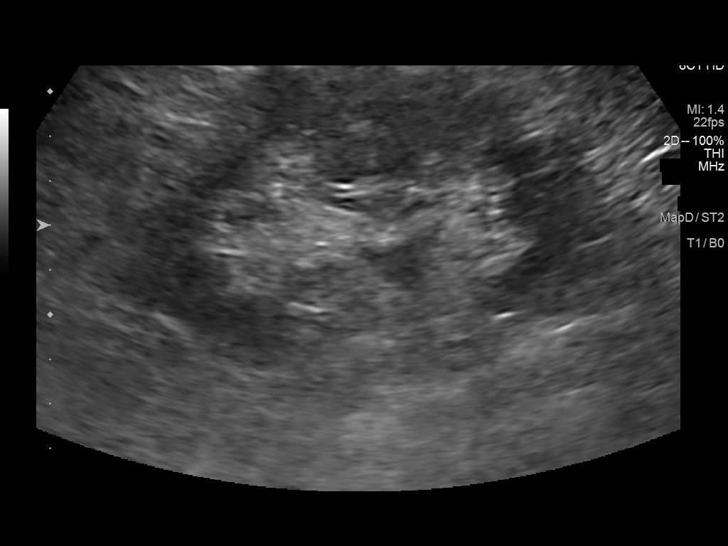
[im 18/33]
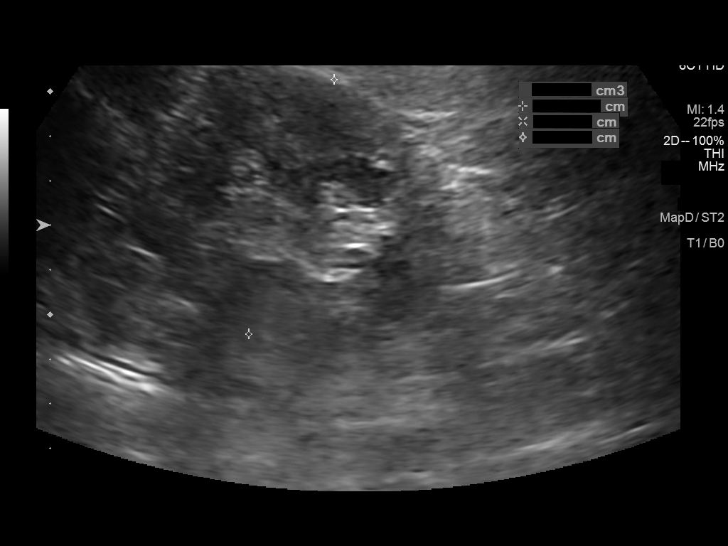
[im 21/33]
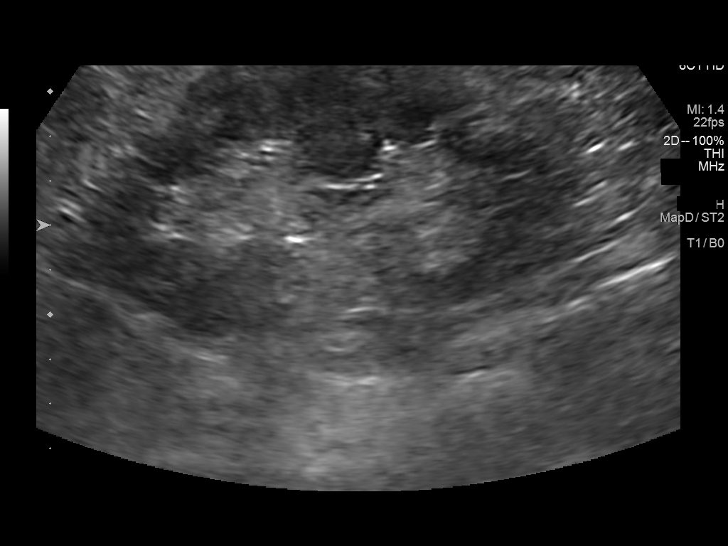
[im 22/33]
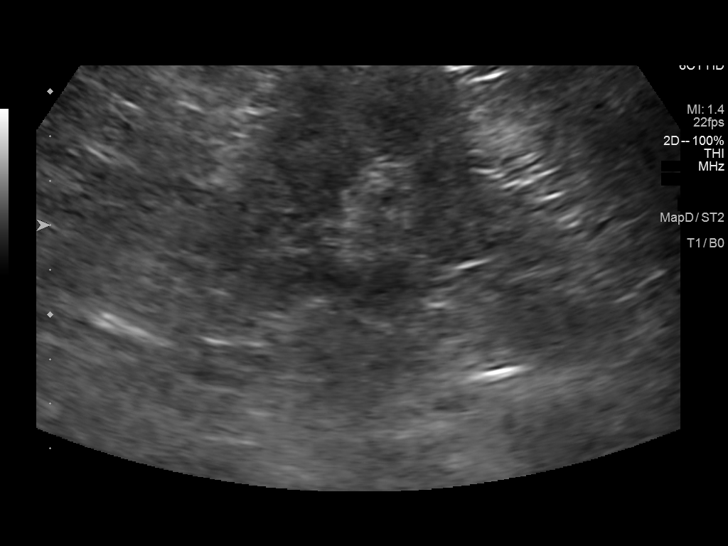
[im 25/33]
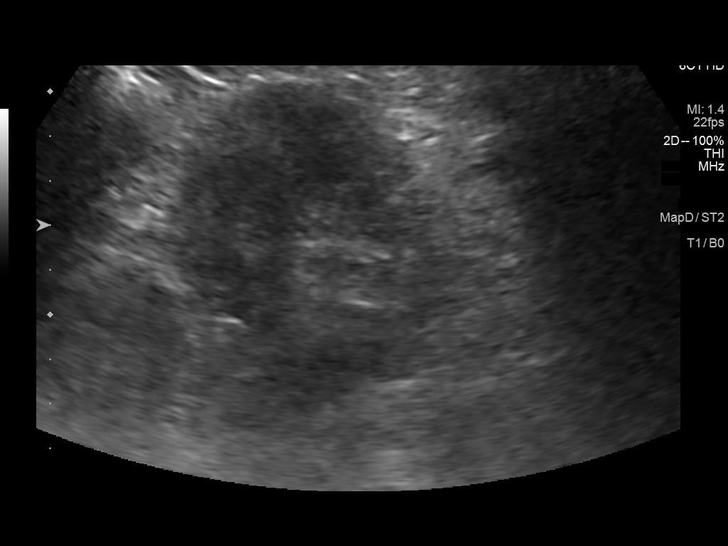
[im 27/33]
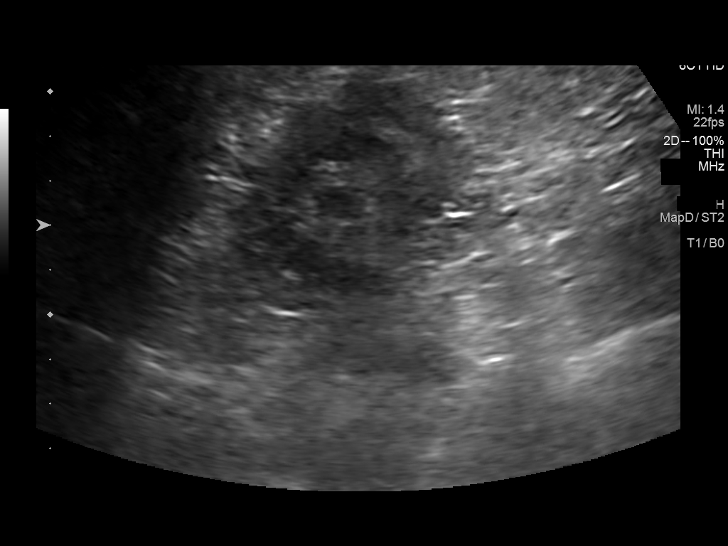
[im 30/33]
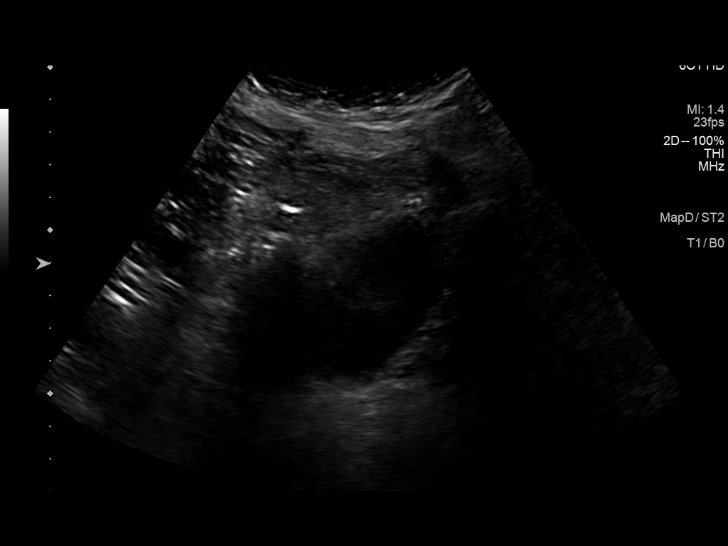
[im 33/33]
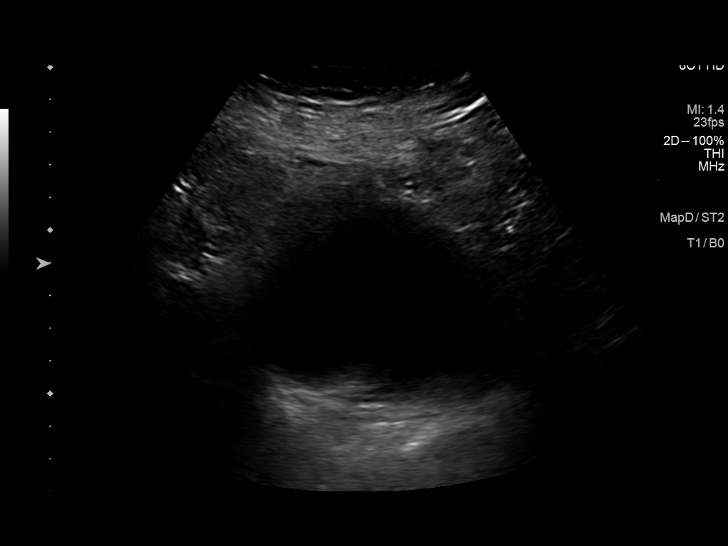

[14 of 25 positions shown; findings below may reference images not displayed]

FINDINGS: Right Kidney:

Renal measurements: 11.5 x 5.7 x 5.4 cm = volume: 185 mL .
Echogenicity within normal limits. No mass or hydronephrosis
visualized.

Left Kidney:

Renal measurements: 11.0 x 6.3 x 6.0 cm = volume: 218 mL.
Echogenicity within normal limits. No mass or hydronephrosis
visualized.

Bladder:

Appears normal for degree of bladder distention.
IMPRESSION: Normal renal ultrasound.

## 2020-05-06 MED FILL — Insulin Degludec Soln Pen-Injector 200 Unit/ML: SUBCUTANEOUS | 30 days supply | Qty: 18 | Fill #0 | Status: AC

## 2020-05-07 ENCOUNTER — Other Ambulatory Visit (HOSPITAL_COMMUNITY): Payer: Self-pay

## 2020-05-07 DIAGNOSIS — E1169 Type 2 diabetes mellitus with other specified complication: Secondary | ICD-10-CM | POA: Diagnosis not present

## 2020-05-07 DIAGNOSIS — Z1159 Encounter for screening for other viral diseases: Secondary | ICD-10-CM | POA: Diagnosis not present

## 2020-05-07 DIAGNOSIS — I519 Heart disease, unspecified: Secondary | ICD-10-CM | POA: Diagnosis not present

## 2020-05-07 DIAGNOSIS — Z0000 Encounter for general adult medical examination without abnormal findings: Secondary | ICD-10-CM | POA: Diagnosis not present

## 2020-05-07 DIAGNOSIS — I4510 Unspecified right bundle-branch block: Secondary | ICD-10-CM | POA: Diagnosis not present

## 2020-05-07 DIAGNOSIS — N183 Chronic kidney disease, stage 3 unspecified: Secondary | ICD-10-CM | POA: Diagnosis not present

## 2020-05-07 DIAGNOSIS — M1990 Unspecified osteoarthritis, unspecified site: Secondary | ICD-10-CM | POA: Diagnosis not present

## 2020-05-07 DIAGNOSIS — E1140 Type 2 diabetes mellitus with diabetic neuropathy, unspecified: Secondary | ICD-10-CM | POA: Diagnosis not present

## 2020-05-07 DIAGNOSIS — I110 Hypertensive heart disease with heart failure: Secondary | ICD-10-CM | POA: Diagnosis not present

## 2020-05-07 DIAGNOSIS — I672 Cerebral atherosclerosis: Secondary | ICD-10-CM | POA: Diagnosis not present

## 2020-05-07 DIAGNOSIS — E7800 Pure hypercholesterolemia, unspecified: Secondary | ICD-10-CM | POA: Diagnosis not present

## 2020-05-07 DIAGNOSIS — Z23 Encounter for immunization: Secondary | ICD-10-CM | POA: Diagnosis not present

## 2020-05-16 MED FILL — Clopidogrel Bisulfate Tab 75 MG (Base Equiv): ORAL | 90 days supply | Qty: 90 | Fill #0 | Status: AC

## 2020-05-17 ENCOUNTER — Other Ambulatory Visit (HOSPITAL_COMMUNITY): Payer: Self-pay

## 2020-05-24 ENCOUNTER — Other Ambulatory Visit (HOSPITAL_COMMUNITY): Payer: Self-pay | Admitting: Nephrology

## 2020-05-24 MED FILL — Pravastatin Sodium Tab 80 MG: ORAL | 90 days supply | Qty: 90 | Fill #0 | Status: AC

## 2020-05-25 ENCOUNTER — Other Ambulatory Visit (HOSPITAL_COMMUNITY): Payer: Self-pay

## 2020-05-25 MED ORDER — DILTIAZEM HCL ER 240 MG PO CP24
240.0000 mg | ORAL_CAPSULE | Freq: Every day | ORAL | 6 refills | Status: DC
  Filled 2020-05-25: qty 30, 30d supply, fill #0
  Filled 2020-06-21: qty 30, 30d supply, fill #1
  Filled 2020-07-21: qty 30, 30d supply, fill #2
  Filled 2020-08-19: qty 30, 30d supply, fill #3
  Filled 2020-09-22: qty 30, 30d supply, fill #4
  Filled 2020-10-19: qty 30, 30d supply, fill #5
  Filled 2020-11-17: qty 30, 30d supply, fill #6

## 2020-06-03 ENCOUNTER — Other Ambulatory Visit (HOSPITAL_COMMUNITY): Payer: Self-pay

## 2020-06-03 MED FILL — Glucose Blood Test Strip: 75 days supply | Qty: 150 | Fill #0 | Status: AC

## 2020-06-08 ENCOUNTER — Other Ambulatory Visit (HOSPITAL_COMMUNITY): Payer: Self-pay

## 2020-06-08 ENCOUNTER — Ambulatory Visit: Payer: Medicare Other | Admitting: Cardiology

## 2020-06-08 MED ORDER — UNIFINE PENTIPS 32G X 4 MM MISC
3 refills | Status: DC
  Filled 2020-06-08 – 2020-06-09 (×2): qty 100, 90d supply, fill #0
  Filled 2020-09-22: qty 100, 90d supply, fill #1
  Filled 2020-12-12: qty 100, 90d supply, fill #2
  Filled 2021-03-20: qty 100, 90d supply, fill #3
  Filled 2021-06-22: qty 100, 100d supply, fill #4

## 2020-06-09 ENCOUNTER — Other Ambulatory Visit (HOSPITAL_COMMUNITY): Payer: Self-pay

## 2020-06-09 MED FILL — Insulin Degludec Soln Pen-Injector 200 Unit/ML: SUBCUTANEOUS | 30 days supply | Qty: 18 | Fill #1 | Status: AC

## 2020-06-10 ENCOUNTER — Other Ambulatory Visit (HOSPITAL_COMMUNITY): Payer: Self-pay

## 2020-06-21 MED FILL — Lancets: 90 days supply | Qty: 200 | Fill #0 | Status: AC

## 2020-06-22 ENCOUNTER — Other Ambulatory Visit (HOSPITAL_COMMUNITY): Payer: Self-pay

## 2020-07-04 IMAGING — US US RENAL ARTERY STENOSIS
1 series · 13 of 25 positions shown · non-contrast
Comparison: 08/08/2012

CLINICAL DATA: Chronic kidney disease stage 3, diabetes

EXAM:
RENAL/URINARY TRACT ULTRASOUND
RENAL DUPLEX DOPPLER ULTRASOUND

[Series 1: us renal artery stenosis · 0.23mm/px · 13 of 61 slices shown]
[im 1/61]
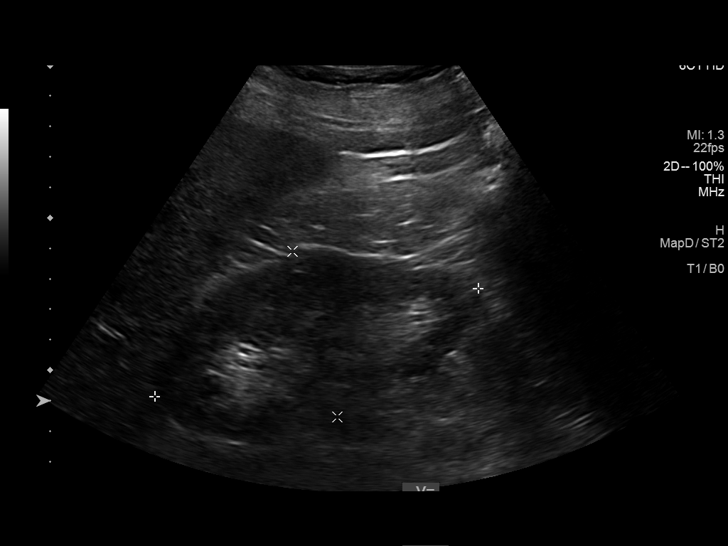
[im 6/61]
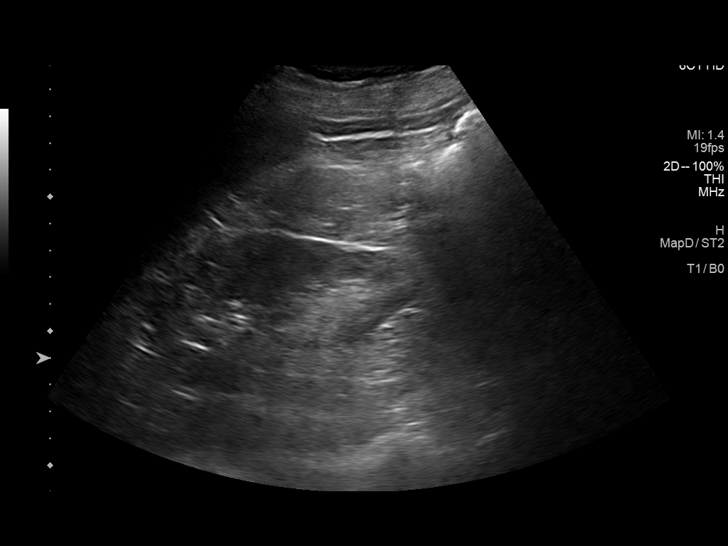
[im 11/61]
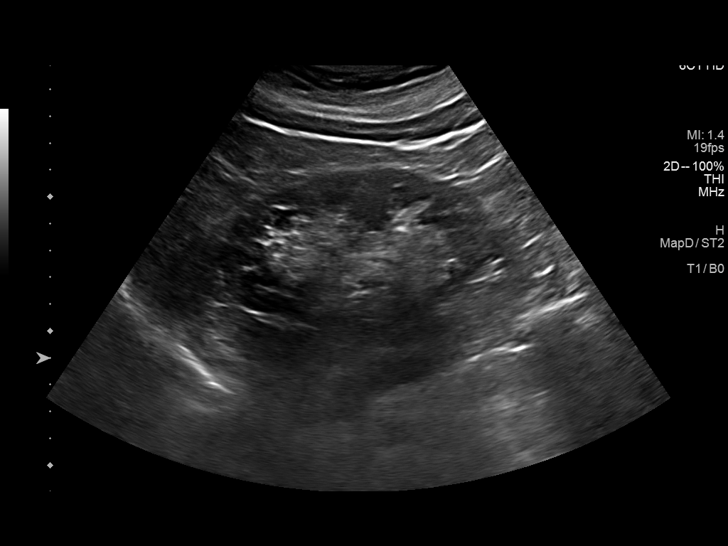
[im 16/61]
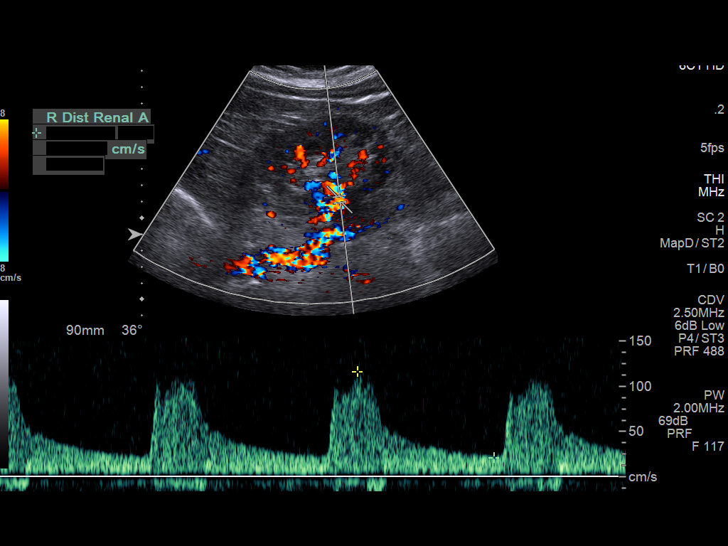
[im 21/61]
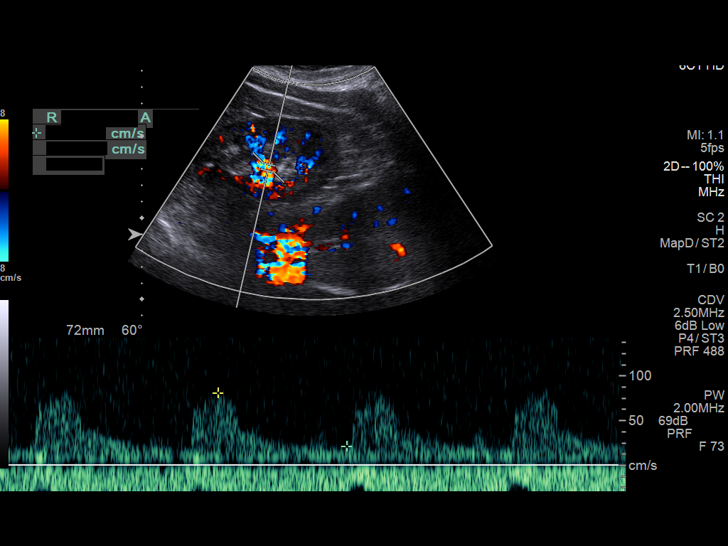
[im 26/61]
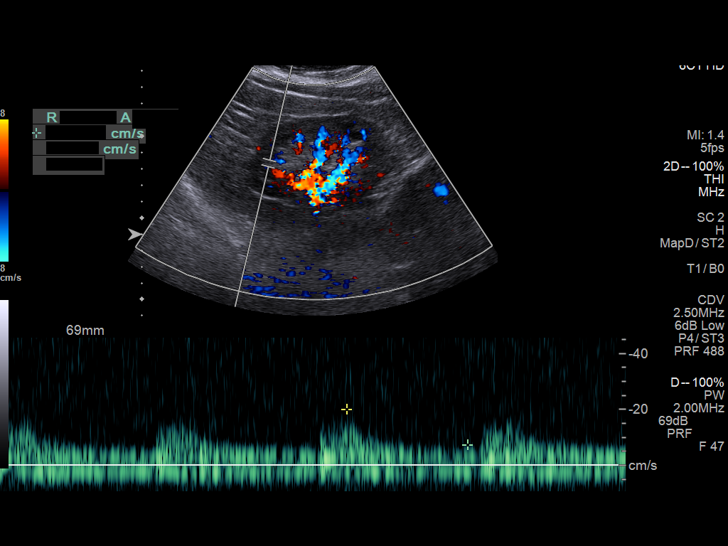
[im 31/61]
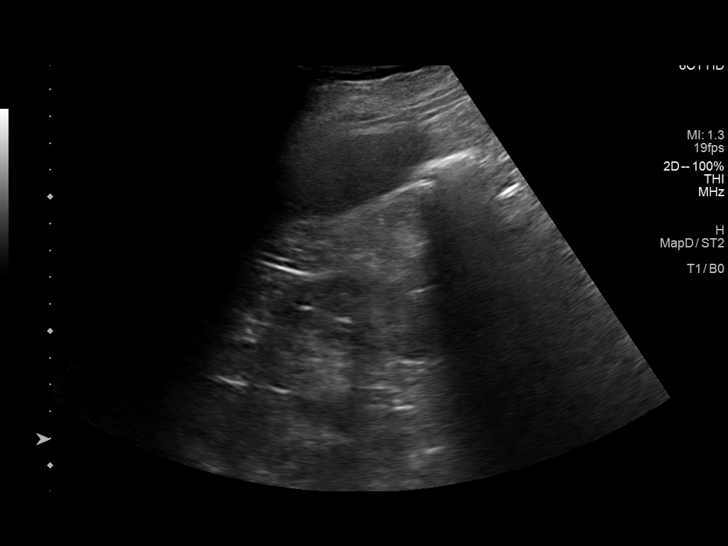
[im 36/61]
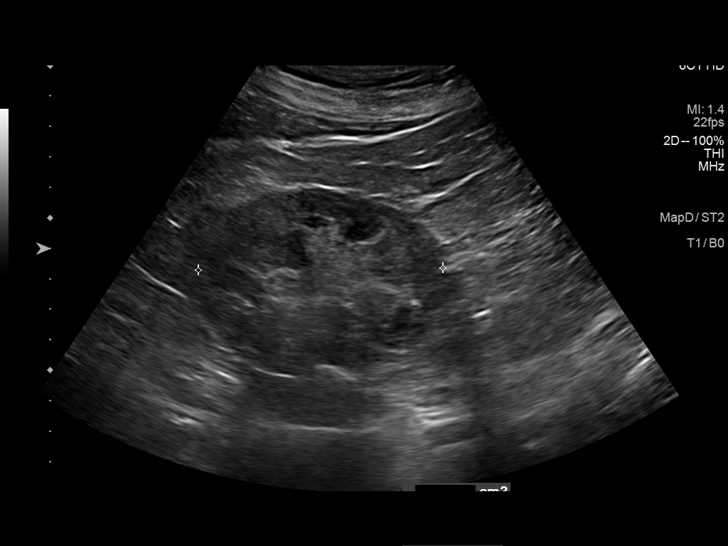
[im 41/61]
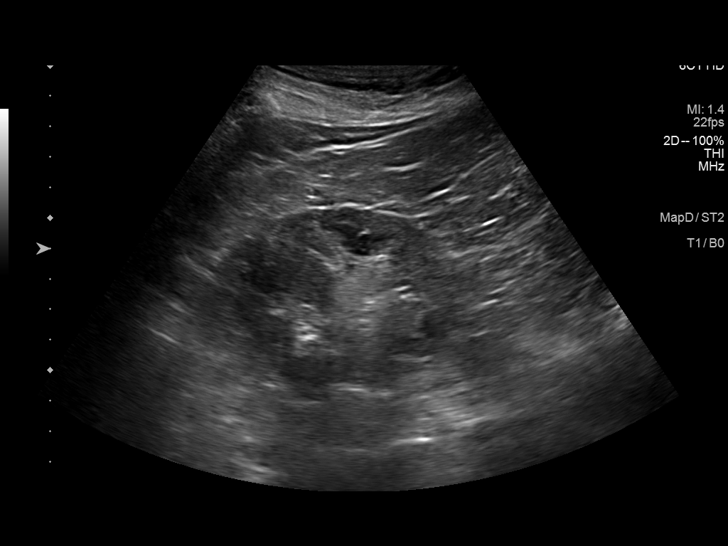
[im 46/61]
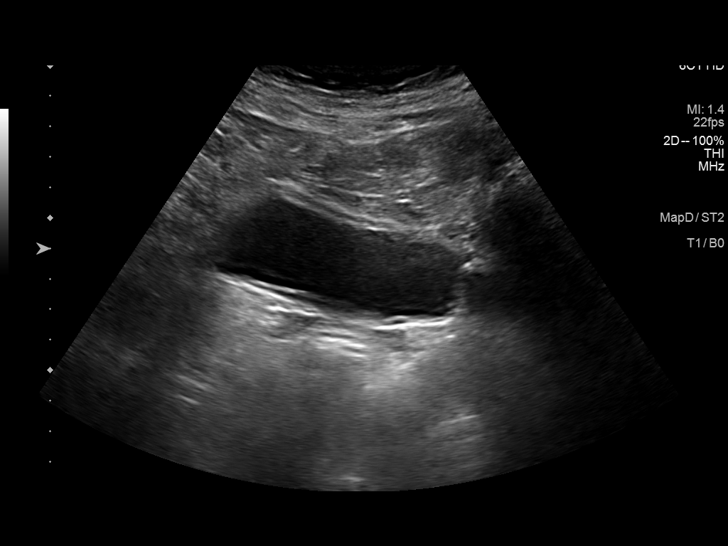
[im 51/61]
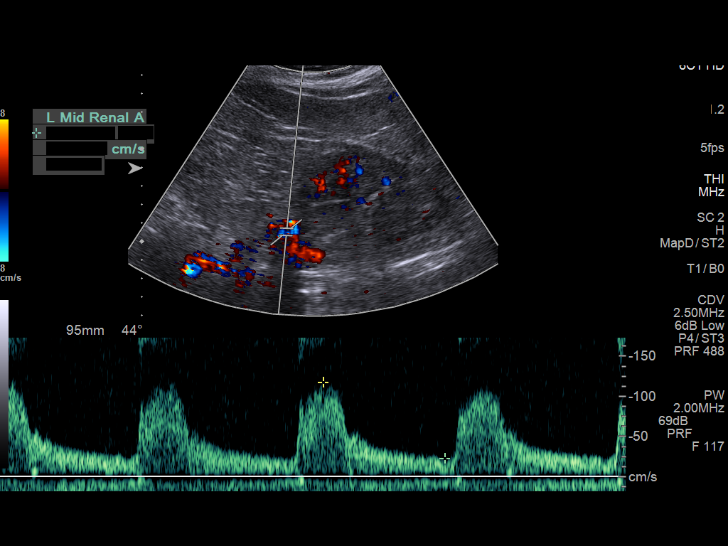
[im 56/61]
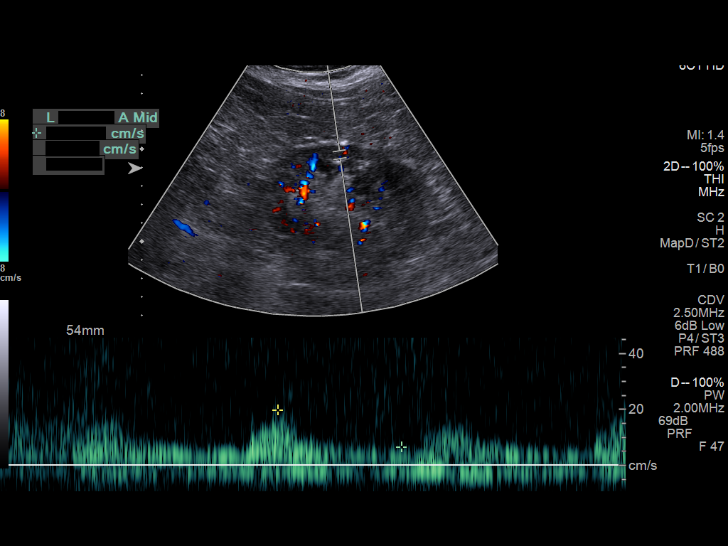
[im 61/61]
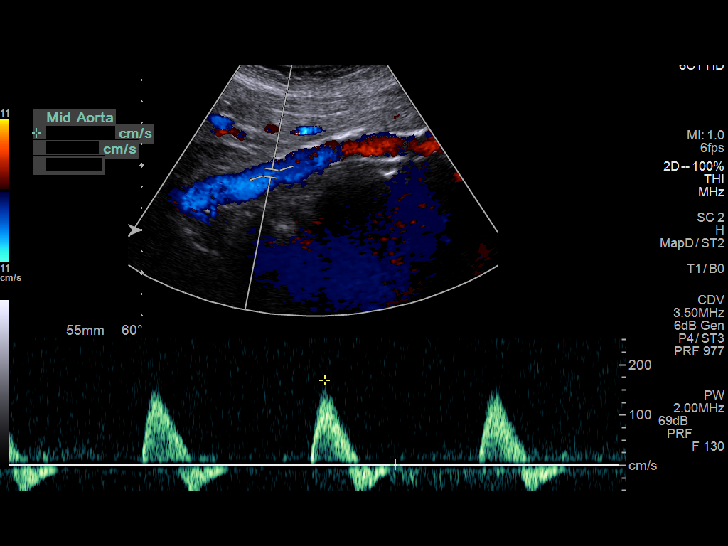

[13 of 25 positions shown; findings below may reference images not displayed]

FINDINGS: Right Kidney:

11.2 x 5.6 x 6.4 cm (volume = 210 cm^3). Echogenicity within
normal limits. No mass or hydronephrosis visualized.

Left Kidney:

11.5 x 5.7 x 8 cm (volume = 300 cm^3). Echogenicity within normal
limits. No mass or hydronephrosis visualized.

Bladder:  Nondistended

RENAL DUPLEX ULTRASOUND

Right Renal Artery Velocities:

Origin:  122 cm/sec

Mid:  96 cm/sec

Hilum:  116 cm/sec

Interlobar:  56 cm/sec

Arcuate:  22 cm/sec

Left Renal Artery Velocities:

Origin:  106 cm/sec

Mid:  86 cm/sec

Hilum:  118 cm/sec

Interlobar:  58 cm/sec

Arcuate:  21 cm/sec

Aortic Velocity:  170 cm/sec

Right Renal-Aortic Ratios:

Origin:

Mid:

Hilum:

Interlobar:

Arcuate:

Left Renal-Aortic Ratios:

Origin:

Mid:

Hilum:

Interlobar:

Arcuate:
IMPRESSION: 1. No hydronephrosis.
2. No renal Doppler evidence of hemodynamically significant renal
artery stenosis.
If there is continued clinical concern, renal MRA (lower radiation
risk, can be performed noncontrast in the setting of renal
dysfunction) and CTA ( higher spatial resolution) represent more
accurate studies, which are additionally more sensitive to the
detection of duplicated renal arteries.

## 2020-07-07 ENCOUNTER — Other Ambulatory Visit (HOSPITAL_COMMUNITY): Payer: Self-pay

## 2020-07-07 MED FILL — Insulin Degludec Soln Pen-Injector 200 Unit/ML: SUBCUTANEOUS | 30 days supply | Qty: 18 | Fill #2 | Status: AC

## 2020-07-13 ENCOUNTER — Encounter: Payer: Self-pay | Admitting: Cardiology

## 2020-07-13 ENCOUNTER — Other Ambulatory Visit: Payer: Self-pay

## 2020-07-13 ENCOUNTER — Ambulatory Visit: Payer: Medicare Other | Admitting: Cardiology

## 2020-07-13 VITALS — BP 130/70 | HR 77 | Ht 66.0 in | Wt 191.0 lb

## 2020-07-13 DIAGNOSIS — I4510 Unspecified right bundle-branch block: Secondary | ICD-10-CM

## 2020-07-13 DIAGNOSIS — I10 Essential (primary) hypertension: Secondary | ICD-10-CM | POA: Diagnosis not present

## 2020-07-13 DIAGNOSIS — R9431 Abnormal electrocardiogram [ECG] [EKG]: Secondary | ICD-10-CM

## 2020-07-13 DIAGNOSIS — E119 Type 2 diabetes mellitus without complications: Secondary | ICD-10-CM

## 2020-07-13 DIAGNOSIS — E7800 Pure hypercholesterolemia, unspecified: Secondary | ICD-10-CM | POA: Diagnosis not present

## 2020-07-13 NOTE — Progress Notes (Signed)
Cardiology Office Note:    Date:  07/13/2020   ID:  Emma Duran, DOB [birth year], MRN [id number]  PCP:  Emma Neer, MD   [facility] Providers Cardiologist:  None     Referring MD: Emma Neer, MD    History of Present Illness:    Emma Duran is a 65 y.o. female here for the evaluation of new onset right bundle branch block picked up on Medicare screening at the request of Emma Duran.  Widow of Emma Duran (has mail room named in his honor-died in 2014-04-16).  She has been treated for diabetes hypertension hyperlipidemia.  She had an echocardiogram in 2017-04-15 secondary to a murmur that showed grade 1 diastolic dysfunction, otherwise negative.  She also had in 15-Apr-2017 a right-sided nonhemorrhagic stroke from atherosclerosis.  She is followed by Dr. Vanetta Duran for chronic kidney disease stage III.  Her father had lung cancer, mother possibly had lung cancer as well.  She never smoked.  No significant alcohol use.  She has tried several statin type medications.  Has had intolerances. Crestor-flulike symptoms WelChol-fatigue and constipation Atorvastatin-muscle cramps  She has been taking diltiazem for hypertension as well as losartan.  Total cholesterol 199 LDL 124 triglycerides 154 HDL 47.  Hemoglobin A1c 9.3.  Creatinine 1.5 potassium 4.0 ALT 14.  Labs as well as further clinical details reviewed from outside clinic note.  Past Medical History:  Diagnosis Date   Abnormal breast exam    chip in right breast as marker for abnormality    Arthritis    knee, hands   Diabetes mellitus    type 2   External hemorrhoids    GERD (gastroesophageal reflux disease)    Heart murmur    never has caused any problems   History of colon polyps    History of kidney stones    surgery done   Hyperlipidemia    Hypertension    Internal hemorrhoids    Stroke [facility])     Past Surgical History:  Procedure Laterality Date   ABDOMINAL HYSTERECTOMY     BREAST LUMPECTOMY  09/28/10    right breast    COLONOSCOPY     ESOPHAGOGASTRODUODENOSCOPY     INCISION AND DRAINAGE ABSCESS Right 01/07/2019   Procedure: INCISION AND DRAINAGE ABSCESS;  Surgeon: Emma Butters, MD;  Location: [facility];  Service: Orthopedics;  Laterality: Right;   KIDNEY STONE SURGERY  2008/04/15   KNEE ARTHROSCOPY WITH MEDIAL MENISECTOMY Right 09/30/2013   Procedure: Right knee arthroscopy partial meniscectomy, debridement;  Surgeon: Emma Sells, MD;  Location: [facility];  Service: Orthopedics;  Laterality: Right;  Right knee arthroscopy partial menisectomy, debridement   WISDOM TOOTH EXTRACTION      Current Medications: Current Meds  Medication Sig   ACCU-CHEK GUIDE test strip USE AS DIRECTED TWO TIMES DAILY TO CHECK BLOOD SUGAR   Accu-Chek Softclix Lancets lancets 2 (two) times daily.   Cholecalciferol (VITAMIN D) 50 MCG (2000 UT) tablet Take 2,000 Units by mouth daily.   clopidogrel (PLAVIX) 75 MG tablet TAKE 1 TABLET BY MOUTH ONCE A DAY   diltiazem (DILACOR XR) 240 MG 24 hr capsule Take 1 capsule (240 mg total) by mouth daily.   ferrous sulfate 325 (65 FE) MG tablet Take 325 mg by mouth daily.   insulin degludec (TRESIBA) 200 UNIT/ML FlexTouch Pen INJECT 90 UNITS INTO THE SKIN ONCE DAILY. TITRATE TO MAXIMUM OF 120 UNITS PER DAY.   Insulin Pen Needle (UNIFINE PENTIPS) 32G X  4 MM MISC Use 1 pen daily with Tresiba   losartan (COZAAR) 25 MG tablet Take 1 tablet (25 mg total) by mouth at bedtime.   Magnesium 200 MG TABS Take 200 mg by mouth daily.   Potassium Bicarbonate 99 MG CAPS Take 99 mg by mouth daily.   pravastatin (PRAVACHOL) 80 MG tablet TAKE 1 TABLET BY MOUTH ONCE DAILY     Allergies:   Omeprazole, Percocet [oxycodone-acetaminophen], Tramadol, and Morphine   Social History   Socioeconomic History   Marital status: Widowed    Spouse name: Not on file   Number of children: 1   Years of education: Not on file   Highest education level: Not on file  Occupational  History   Occupation: OFFICE MANAGER    Employer: [employer]  Tobacco Use   Smoking status: Never   Smokeless tobacco: Never  Vaping Use   Vaping Use: Never used  Substance and Sexual Activity   Alcohol use: No    Alcohol/week: 0.0 standard drinks   Drug use: No   Sexual activity: Yes    Birth control/protection: Post-menopausal, None    Comment: hysterectomy  Other Topics Concern   Not on file  Social History Narrative   Not on file   Social Determinants of Health   Financial Resource Strain: Not on file  Food Insecurity: Not on file  Transportation Needs: Not on file  Physical Activity: Not on file  Stress: Not on file  Social Connections: Not on file     Family History: The patient's family history includes Cancer in her paternal grandfather; Cancer (age of onset: 33) in her father; Diabetes in her mother; Hypertension in an other family member; Lung cancer in her father and paternal grandmother. There is no history of Colon cancer, Stomach cancer, Esophageal cancer, Colon polyps, or Rectal cancer.  ROS:   Please see the history of present illness.     All other systems reviewed and are negative.  EKGs/Labs/Other Studies Reviewed:    The following studies were reviewed today:  ECHO 05/30/17: - Left ventricle: The cavity size was normal. Wall thickness was    normal. Systolic function was normal. The estimated ejection    fraction was in the range of 60% to 65%. Wall motion was normal;    there were no regional wall motion abnormalities. Doppler    parameters are consistent with abnormal left ventricular    relaxation (grade 1 diastolic dysfunction). The E/e&' ratio is    >15, suggesting elevated LV filling pressure.  - Aortic valve: Trileaflet. Sclerosis without stenosis. There was    no regurgitation.  - Left atrium: The atrium was normal in size.  - Inferior vena cava: The vessel was normal in size. The    respirophasic diameter changes were in  the normal range (>= 50%),    consistent with normal central venous pressure.   Impressions:   - LVEF 60-65%, normal wall thickness, normal wall motion, grade 1    DD, elevated LV filling pressure, aortic valve sclerosis, normal    LA size, normal IVC.   EKG:  EKG is  ordered today.  The ekg ordered today demonstrates SR LBBB, EKG from outside office personally reviewed and interpreted from 05/07/2020-right bundle branch block 65 bpm with no other abnormalities.  Sinus rhythm.  Recent Labs: No results found for requested labs within last 8760 hours.  Recent Lipid Panel    Component Value Date/Time   CHOL 229 (H) 05/30/2017 0277  TRIG 158 (H) 05/30/2017 0646   HDL 41 05/30/2017 0646   CHOLHDL 5.6 05/30/2017 0646   VLDL 32 05/30/2017 0646   LDLCALC 156 (H) 05/30/2017 0646     Risk Assessment/Calculations:          Physical Exam:    VS:  BP 130/70 (BP Location: Left Arm, Patient Position: Sitting, Cuff Size: Normal)   Duran 77   Ht 5\' 6"  (1.676 m)   Wt 191 lb (86.6 kg)   BMI 30.83 kg/m     Wt Readings from Last 3 Encounters:  07/13/20 191 lb (86.6 kg)  01/07/19 175 lb 0.7 oz (79.4 kg)  01/05/19 175 lb (79.4 kg)     GEN:  Well nourished, well developed in no acute distress HEENT: Normal NECK: No JVD; No carotid bruits LYMPHATICS: No lymphadenopathy CARDIAC: RRR, no murmurs, rubs, gallops RESPIRATORY:  Clear to auscultation without rales, wheezing or rhonchi  ABDOMEN: Soft, non-tender, non-distended MUSCULOSKELETAL:  No edema; No deformity  SKIN: Warm and dry NEUROLOGIC:  Alert and oriented x 3 PSYCHIATRIC:  Normal affect   ASSESSMENT:    1. RBBB   2. Essential hypertension   3. Pure hypercholesterolemia   4. Diabetes mellitus with coincident hypertension ([place])   5. Nonspecific abnormal electrocardiogram (ECG) (EKG)    PLAN:    In order of problems listed above:  Right bundle branch block - Picked up on Medicare screening ECG.  No symptoms of syncope  chest pain shortness of breath. -I will go ahead and repeat echocardiogram to make sure that there is been no change in morphology.  Otherwise, as long as there are no further significant deterioration of electrical system, isolated right bundle branch block should be ultimately benign.  She has not had any high risk symptoms such as syncope.  Prior stroke 05/2017 -Had a left facial and left arm numbness and tingling.  Lateral right thalamus right internal capsule  Diabetes with hypertension and hyperlipidemia - Has had trouble in the past with statin type medications.  Continue with diltiazem and losartan for medical management of hypertension.     Medication Adjustments/Labs and Tests Ordered: Current medicines are reviewed at length with the patient today.  Concerns regarding medicines are outlined above.  Orders Placed This Encounter  Procedures   EKG 12-Lead   ECHOCARDIOGRAM COMPLETE    No orders of the defined types were placed in this encounter.   Patient Instructions  Medication Instructions:  The current medical regimen is effective;  continue present plan and medications.  *If you need a refill on your cardiac medications before your next appointment, please call your pharmacy*  Testing/Procedures: Your physician has requested that you have an echocardiogram. Echocardiography is a painless test that uses sound waves to create images of your heart. It provides your doctor with information about the size and shape of your heart and how well your heart's chambers and valves are working. This procedure takes approximately one hour. There are no restrictions for this procedure.  Follow-Up: At [facility], you and your health needs are our priority.  As part of our continuing mission to provide you with exceptional heart care, we have created designated Provider Care Teams.  These Care Teams include your primary Cardiologist (physician) and Advanced Practice Providers (APPs -   Physician Assistants and Nurse Practitioners) who all work together to provide you with the care you need, when you need it.  We recommend signing up for the patient portal called "MyChart".  Sign up  information is provided on this After Visit Summary.  MyChart is used to connect with patients for Virtual Visits (Telemedicine).  Patients are able to view lab/test results, encounter notes, upcoming appointments, etc.  Non-urgent messages can be sent to your provider as well.   To learn more about what you can do with MyChart, go to [email or web address].    Your next appointment:   1 year(s)  The format for your next appointment:   In Person  Provider:   Candee Furbish, MD   Thank you for choosing [facility]!!     Signed, Emma Furbish, MD  07/13/2020 3:11 PM    [place]

## 2020-07-13 NOTE — Patient Instructions (Signed)
Medication Instructions:  The current medical regimen is effective;  continue present plan and medications.  *If you need a refill on your cardiac medications before your next appointment, please call your pharmacy*  Testing/Procedures: Your physician has requested that you have an echocardiogram. Echocardiography is a painless test that uses sound waves to create images of your heart. It provides your doctor with information about the size and shape of your heart and how well your heart's chambers and valves are working. This procedure takes approximately one hour. There are no restrictions for this procedure.  Follow-Up: At [facility], you and your health needs are our priority.  As part of our continuing mission to provide you with exceptional heart care, we have created designated Provider Care Teams.  These Care Teams include your primary Cardiologist (physician) and Advanced Practice Providers (APPs -  Physician Assistants and Nurse Practitioners) who all work together to provide you with the care you need, when you need it.  We recommend signing up for the patient portal called "MyChart".  Sign up information is provided on this After Visit Summary.  MyChart is used to connect with patients for Virtual Visits (Telemedicine).  Patients are able to view lab/test results, encounter notes, upcoming appointments, etc.  Non-urgent messages can be sent to your provider as well.   To learn more about what you can do with MyChart, go to [email or web address].    Your next appointment:   1 year(s)  The format for your next appointment:   In Person  Provider:   Mark Skains, MD     Thank you for choosing [facility]!!    

## 2020-07-21 ENCOUNTER — Other Ambulatory Visit (HOSPITAL_COMMUNITY): Payer: Self-pay

## 2020-07-30 ENCOUNTER — Ambulatory Visit (HOSPITAL_COMMUNITY): Payer: Medicare Other | Attending: Cardiology

## 2020-07-30 ENCOUNTER — Other Ambulatory Visit: Payer: Self-pay

## 2020-07-30 DIAGNOSIS — R9431 Abnormal electrocardiogram [ECG] [EKG]: Secondary | ICD-10-CM

## 2020-07-30 DIAGNOSIS — I10 Essential (primary) hypertension: Secondary | ICD-10-CM | POA: Diagnosis not present

## 2020-07-30 LAB — ECHOCARDIOGRAM COMPLETE
AR max vel: 1.31 cm2
AV Area VTI: 1.51 cm2
AV Area mean vel: 1.14 cm2
AV Mean grad: 12 mmHg
AV Peak grad: 19.9 mmHg
Ao pk vel: 2.23 m/s
Area-P 1/2: 2.2 cm2
S' Lateral: 2.5 cm

## 2020-07-30 MED ORDER — PERFLUTREN LIPID MICROSPHERE
1.0000 mL | INTRAVENOUS | Status: AC | PRN
  Administered 2020-07-30: 3 mL via INTRAVENOUS

## 2020-08-05 ENCOUNTER — Other Ambulatory Visit (HOSPITAL_COMMUNITY): Payer: Self-pay

## 2020-08-11 ENCOUNTER — Other Ambulatory Visit (HOSPITAL_COMMUNITY): Payer: Self-pay

## 2020-08-11 MED FILL — Clopidogrel Bisulfate Tab 75 MG (Base Equiv): ORAL | 90 days supply | Qty: 90 | Fill #1 | Status: AC

## 2020-08-11 MED FILL — Insulin Degludec Soln Pen-Injector 200 Unit/ML: SUBCUTANEOUS | 30 days supply | Qty: 18 | Fill #3 | Status: AC

## 2020-08-17 ENCOUNTER — Other Ambulatory Visit (HOSPITAL_COMMUNITY): Payer: Self-pay

## 2020-08-18 ENCOUNTER — Other Ambulatory Visit (HOSPITAL_COMMUNITY): Payer: Self-pay

## 2020-08-18 MED ORDER — PRAVASTATIN SODIUM 80 MG PO TABS
80.0000 mg | ORAL_TABLET | Freq: Every day | ORAL | 1 refills | Status: DC
  Filled 2020-08-18: qty 90, 90d supply, fill #0
  Filled 2020-11-17: qty 90, 90d supply, fill #1

## 2020-08-19 ENCOUNTER — Other Ambulatory Visit (HOSPITAL_COMMUNITY): Payer: Self-pay

## 2020-08-19 MED ORDER — PRAVASTATIN SODIUM 80 MG PO TABS
80.0000 mg | ORAL_TABLET | Freq: Every day | ORAL | 0 refills | Status: DC
  Filled 2020-08-19: qty 90, 90d supply, fill #0

## 2020-08-26 DIAGNOSIS — H00025 Hordeolum internum left lower eyelid: Secondary | ICD-10-CM | POA: Diagnosis not present

## 2020-08-26 DIAGNOSIS — J3489 Other specified disorders of nose and nasal sinuses: Secondary | ICD-10-CM | POA: Diagnosis not present

## 2020-09-01 ENCOUNTER — Other Ambulatory Visit (HOSPITAL_COMMUNITY): Payer: Self-pay

## 2020-09-01 DIAGNOSIS — N179 Acute kidney failure, unspecified: Secondary | ICD-10-CM | POA: Diagnosis not present

## 2020-09-01 DIAGNOSIS — I639 Cerebral infarction, unspecified: Secondary | ICD-10-CM | POA: Diagnosis not present

## 2020-09-01 DIAGNOSIS — N183 Chronic kidney disease, stage 3 unspecified: Secondary | ICD-10-CM | POA: Diagnosis not present

## 2020-09-01 DIAGNOSIS — I129 Hypertensive chronic kidney disease with stage 1 through stage 4 chronic kidney disease, or unspecified chronic kidney disease: Secondary | ICD-10-CM | POA: Diagnosis not present

## 2020-09-01 DIAGNOSIS — D631 Anemia in chronic kidney disease: Secondary | ICD-10-CM | POA: Diagnosis not present

## 2020-09-01 DIAGNOSIS — E1122 Type 2 diabetes mellitus with diabetic chronic kidney disease: Secondary | ICD-10-CM | POA: Diagnosis not present

## 2020-09-01 DIAGNOSIS — M1990 Unspecified osteoarthritis, unspecified site: Secondary | ICD-10-CM | POA: Diagnosis not present

## 2020-09-13 DIAGNOSIS — D485 Neoplasm of uncertain behavior of skin: Secondary | ICD-10-CM | POA: Diagnosis not present

## 2020-09-13 DIAGNOSIS — L820 Inflamed seborrheic keratosis: Secondary | ICD-10-CM | POA: Diagnosis not present

## 2020-09-13 DIAGNOSIS — L578 Other skin changes due to chronic exposure to nonionizing radiation: Secondary | ICD-10-CM | POA: Diagnosis not present

## 2020-09-13 DIAGNOSIS — C44319 Basal cell carcinoma of skin of other parts of face: Secondary | ICD-10-CM | POA: Diagnosis not present

## 2020-09-14 ENCOUNTER — Other Ambulatory Visit (HOSPITAL_COMMUNITY): Payer: Self-pay

## 2020-09-14 MED FILL — Insulin Degludec Soln Pen-Injector 200 Unit/ML: SUBCUTANEOUS | 30 days supply | Qty: 18 | Fill #4 | Status: CN

## 2020-09-14 MED FILL — Insulin Degludec Soln Pen-Injector 200 Unit/ML: SUBCUTANEOUS | 30 days supply | Qty: 18 | Fill #4 | Status: AC

## 2020-09-23 ENCOUNTER — Other Ambulatory Visit (HOSPITAL_COMMUNITY): Payer: Self-pay

## 2020-10-03 ENCOUNTER — Other Ambulatory Visit (HOSPITAL_COMMUNITY): Payer: Self-pay

## 2020-10-04 ENCOUNTER — Other Ambulatory Visit (HOSPITAL_COMMUNITY): Payer: Self-pay

## 2020-10-04 DIAGNOSIS — L900 Lichen sclerosus et atrophicus: Secondary | ICD-10-CM | POA: Diagnosis not present

## 2020-10-04 DIAGNOSIS — L293 Anogenital pruritus, unspecified: Secondary | ICD-10-CM | POA: Diagnosis not present

## 2020-10-04 MED ORDER — LOSARTAN POTASSIUM 25 MG PO TABS
25.0000 mg | ORAL_TABLET | Freq: Every day | ORAL | 5 refills | Status: DC
  Filled 2020-10-04: qty 30, 30d supply, fill #0
  Filled 2020-11-02: qty 30, 30d supply, fill #1
  Filled 2020-12-01: qty 30, 30d supply, fill #2
  Filled 2021-01-02: qty 30, 30d supply, fill #3
  Filled 2021-02-02: qty 30, 30d supply, fill #4
  Filled 2021-02-27: qty 30, 30d supply, fill #5

## 2020-10-04 MED ORDER — CLOBETASOL PROPIONATE 0.05 % EX CREA
1.0000 "application " | TOPICAL_CREAM | Freq: Two times a day (BID) | CUTANEOUS | 2 refills | Status: AC
  Filled 2020-10-04: qty 30, 15d supply, fill #0
  Filled 2021-08-10: qty 30, 15d supply, fill #1

## 2020-10-05 ENCOUNTER — Other Ambulatory Visit (HOSPITAL_COMMUNITY): Payer: Self-pay

## 2020-10-05 MED ORDER — FLUCONAZOLE 150 MG PO TABS
150.0000 mg | ORAL_TABLET | Freq: Every day | ORAL | 0 refills | Status: DC
  Filled 2020-10-05: qty 2, 3d supply, fill #0

## 2020-10-12 DIAGNOSIS — Z1231 Encounter for screening mammogram for malignant neoplasm of breast: Secondary | ICD-10-CM | POA: Diagnosis not present

## 2020-10-14 ENCOUNTER — Other Ambulatory Visit (HOSPITAL_COMMUNITY): Payer: Self-pay

## 2020-10-14 MED ORDER — FLUCONAZOLE 150 MG PO TABS
150.0000 mg | ORAL_TABLET | Freq: Every day | ORAL | 0 refills | Status: DC
  Filled 2020-10-14: qty 2, 3d supply, fill #0

## 2020-10-17 MED FILL — Insulin Degludec Soln Pen-Injector 200 Unit/ML: SUBCUTANEOUS | 30 days supply | Qty: 18 | Fill #5 | Status: AC

## 2020-10-18 ENCOUNTER — Other Ambulatory Visit (HOSPITAL_COMMUNITY): Payer: Self-pay

## 2020-10-18 DIAGNOSIS — L900 Lichen sclerosus et atrophicus: Secondary | ICD-10-CM | POA: Diagnosis not present

## 2020-10-19 ENCOUNTER — Other Ambulatory Visit (HOSPITAL_COMMUNITY): Payer: Self-pay

## 2020-10-19 MED ORDER — ACCU-CHEK GUIDE VI STRP
ORAL_STRIP | 3 refills | Status: DC
  Filled 2020-10-19: qty 150, 75d supply, fill #0
  Filled 2021-02-02: qty 150, 75d supply, fill #1
  Filled 2021-07-19: qty 150, 75d supply, fill #2

## 2020-10-20 ENCOUNTER — Other Ambulatory Visit (HOSPITAL_COMMUNITY): Payer: Self-pay

## 2020-10-20 DIAGNOSIS — N6001 Solitary cyst of right breast: Secondary | ICD-10-CM | POA: Diagnosis not present

## 2020-10-20 DIAGNOSIS — R928 Other abnormal and inconclusive findings on diagnostic imaging of breast: Secondary | ICD-10-CM | POA: Diagnosis not present

## 2020-11-02 ENCOUNTER — Other Ambulatory Visit (HOSPITAL_COMMUNITY): Payer: Self-pay

## 2020-11-08 DIAGNOSIS — D485 Neoplasm of uncertain behavior of skin: Secondary | ICD-10-CM | POA: Diagnosis not present

## 2020-11-08 DIAGNOSIS — L820 Inflamed seborrheic keratosis: Secondary | ICD-10-CM | POA: Diagnosis not present

## 2020-11-09 ENCOUNTER — Other Ambulatory Visit (HOSPITAL_COMMUNITY): Payer: Self-pay

## 2020-11-10 ENCOUNTER — Other Ambulatory Visit (HOSPITAL_COMMUNITY): Payer: Self-pay

## 2020-11-11 ENCOUNTER — Other Ambulatory Visit (HOSPITAL_COMMUNITY): Payer: Self-pay

## 2020-11-11 DIAGNOSIS — E1169 Type 2 diabetes mellitus with other specified complication: Secondary | ICD-10-CM | POA: Diagnosis not present

## 2020-11-11 DIAGNOSIS — I110 Hypertensive heart disease with heart failure: Secondary | ICD-10-CM | POA: Diagnosis not present

## 2020-11-11 DIAGNOSIS — N183 Chronic kidney disease, stage 3 unspecified: Secondary | ICD-10-CM | POA: Diagnosis not present

## 2020-11-11 DIAGNOSIS — Z23 Encounter for immunization: Secondary | ICD-10-CM | POA: Diagnosis not present

## 2020-11-11 DIAGNOSIS — E7800 Pure hypercholesterolemia, unspecified: Secondary | ICD-10-CM | POA: Diagnosis not present

## 2020-11-11 MED ORDER — CLOPIDOGREL BISULFATE 75 MG PO TABS
75.0000 mg | ORAL_TABLET | Freq: Every day | ORAL | 3 refills | Status: DC
  Filled 2020-11-11: qty 90, 90d supply, fill #0
  Filled 2021-02-08: qty 90, 90d supply, fill #1
  Filled 2021-05-10: qty 90, 90d supply, fill #2
  Filled 2021-08-03: qty 90, 90d supply, fill #3

## 2020-11-17 ENCOUNTER — Other Ambulatory Visit (HOSPITAL_COMMUNITY): Payer: Self-pay

## 2020-11-17 MED ORDER — TRESIBA FLEXTOUCH 200 UNIT/ML ~~LOC~~ SOPN
112.0000 [IU] | PEN_INJECTOR | Freq: Every day | SUBCUTANEOUS | 7 refills | Status: DC
  Filled 2020-11-17: qty 15, 26d supply, fill #0
  Filled 2020-12-12: qty 15, 26d supply, fill #1
  Filled 2021-01-06: qty 15, 26d supply, fill #2
  Filled 2021-02-02: qty 15, 26d supply, fill #3
  Filled 2021-02-27: qty 15, 26d supply, fill #4
  Filled 2021-03-21: qty 15, 26d supply, fill #5
  Filled 2021-04-20: qty 15, 26d supply, fill #6
  Filled 2021-05-12: qty 15, 26d supply, fill #7
  Filled 2021-06-08: qty 15, 26d supply, fill #8

## 2020-11-18 ENCOUNTER — Other Ambulatory Visit (HOSPITAL_COMMUNITY): Payer: Self-pay

## 2020-11-19 ENCOUNTER — Other Ambulatory Visit (HOSPITAL_COMMUNITY): Payer: Self-pay

## 2020-11-22 ENCOUNTER — Other Ambulatory Visit (HOSPITAL_COMMUNITY): Payer: Self-pay

## 2020-11-22 MED ORDER — TRESIBA FLEXTOUCH 200 UNIT/ML ~~LOC~~ SOPN
115.0000 [IU] | PEN_INJECTOR | Freq: Every day | SUBCUTANEOUS | 3 refills | Status: AC
  Filled 2020-11-22: qty 51, 81d supply, fill #0
  Filled 2021-06-27: qty 15, 26d supply, fill #0

## 2020-12-02 ENCOUNTER — Other Ambulatory Visit (HOSPITAL_COMMUNITY): Payer: Self-pay

## 2020-12-13 ENCOUNTER — Other Ambulatory Visit (HOSPITAL_COMMUNITY): Payer: Self-pay

## 2020-12-13 MED ORDER — ACCU-CHEK SOFTCLIX LANCETS MISC
3 refills | Status: DC
  Filled 2020-12-13: qty 100, 90d supply, fill #0
  Filled 2021-05-04: qty 100, 90d supply, fill #1
  Filled 2021-08-03: qty 100, 90d supply, fill #2
  Filled 2021-10-10: qty 100, 90d supply, fill #3

## 2020-12-14 ENCOUNTER — Other Ambulatory Visit (HOSPITAL_COMMUNITY): Payer: Self-pay

## 2020-12-14 MED ORDER — DILTIAZEM HCL ER 240 MG PO CP24
240.0000 mg | ORAL_CAPSULE | Freq: Every day | ORAL | 6 refills | Status: DC
  Filled 2020-12-14: qty 30, 30d supply, fill #0
  Filled 2021-01-18: qty 30, 30d supply, fill #1
  Filled 2021-02-16: qty 30, 30d supply, fill #2
  Filled 2021-03-20: qty 30, 30d supply, fill #3
  Filled 2021-04-20: qty 30, 30d supply, fill #4
  Filled 2021-05-16: qty 30, 30d supply, fill #5
  Filled 2021-06-21: qty 30, 30d supply, fill #6

## 2021-01-03 ENCOUNTER — Other Ambulatory Visit (HOSPITAL_COMMUNITY): Payer: Self-pay

## 2021-01-06 ENCOUNTER — Other Ambulatory Visit (HOSPITAL_COMMUNITY): Payer: Self-pay

## 2021-01-07 ENCOUNTER — Other Ambulatory Visit (HOSPITAL_COMMUNITY): Payer: Self-pay

## 2021-01-19 ENCOUNTER — Other Ambulatory Visit (HOSPITAL_COMMUNITY): Payer: Self-pay

## 2021-02-02 ENCOUNTER — Other Ambulatory Visit (HOSPITAL_COMMUNITY): Payer: Self-pay

## 2021-02-09 ENCOUNTER — Other Ambulatory Visit (HOSPITAL_COMMUNITY): Payer: Self-pay

## 2021-02-16 ENCOUNTER — Other Ambulatory Visit (HOSPITAL_COMMUNITY): Payer: Self-pay

## 2021-02-17 ENCOUNTER — Other Ambulatory Visit (HOSPITAL_COMMUNITY): Payer: Self-pay

## 2021-02-17 MED ORDER — PRAVASTATIN SODIUM 80 MG PO TABS
80.0000 mg | ORAL_TABLET | Freq: Every day | ORAL | 1 refills | Status: DC
  Filled 2021-02-17: qty 90, 90d supply, fill #0
  Filled 2021-05-16: qty 90, 90d supply, fill #1

## 2021-02-28 ENCOUNTER — Other Ambulatory Visit (HOSPITAL_COMMUNITY): Payer: Self-pay

## 2021-03-16 DIAGNOSIS — M1712 Unilateral primary osteoarthritis, left knee: Secondary | ICD-10-CM | POA: Diagnosis not present

## 2021-03-16 DIAGNOSIS — M1711 Unilateral primary osteoarthritis, right knee: Secondary | ICD-10-CM | POA: Diagnosis not present

## 2021-03-21 ENCOUNTER — Other Ambulatory Visit (HOSPITAL_COMMUNITY): Payer: Self-pay

## 2021-03-29 ENCOUNTER — Other Ambulatory Visit (HOSPITAL_COMMUNITY): Payer: Self-pay

## 2021-03-30 ENCOUNTER — Other Ambulatory Visit (HOSPITAL_COMMUNITY): Payer: Self-pay

## 2021-03-30 MED ORDER — LOSARTAN POTASSIUM 25 MG PO TABS
25.0000 mg | ORAL_TABLET | Freq: Every day | ORAL | 5 refills | Status: DC
  Filled 2021-03-30: qty 30, 30d supply, fill #0
  Filled 2021-04-26: qty 30, 30d supply, fill #1
  Filled 2021-05-30: qty 30, 30d supply, fill #2
  Filled 2021-06-27: qty 30, 30d supply, fill #3
  Filled 2021-07-26: qty 30, 30d supply, fill #4
  Filled 2021-08-23: qty 30, 30d supply, fill #5

## 2021-04-04 ENCOUNTER — Other Ambulatory Visit (HOSPITAL_COMMUNITY): Payer: Self-pay

## 2021-04-20 ENCOUNTER — Other Ambulatory Visit (HOSPITAL_COMMUNITY): Payer: Self-pay

## 2021-04-21 ENCOUNTER — Other Ambulatory Visit (HOSPITAL_COMMUNITY): Payer: Self-pay

## 2021-04-27 ENCOUNTER — Other Ambulatory Visit (HOSPITAL_COMMUNITY): Payer: Self-pay

## 2021-05-03 ENCOUNTER — Other Ambulatory Visit (HOSPITAL_COMMUNITY): Payer: Self-pay

## 2021-05-04 ENCOUNTER — Other Ambulatory Visit (HOSPITAL_COMMUNITY): Payer: Self-pay

## 2021-05-10 ENCOUNTER — Other Ambulatory Visit (HOSPITAL_COMMUNITY): Payer: Self-pay

## 2021-05-12 ENCOUNTER — Other Ambulatory Visit (HOSPITAL_COMMUNITY): Payer: Self-pay

## 2021-05-13 ENCOUNTER — Other Ambulatory Visit (HOSPITAL_COMMUNITY): Payer: Self-pay

## 2021-05-13 DIAGNOSIS — I110 Hypertensive heart disease with heart failure: Secondary | ICD-10-CM | POA: Diagnosis not present

## 2021-05-13 DIAGNOSIS — I4510 Unspecified right bundle-branch block: Secondary | ICD-10-CM | POA: Diagnosis not present

## 2021-05-13 DIAGNOSIS — I519 Heart disease, unspecified: Secondary | ICD-10-CM | POA: Diagnosis not present

## 2021-05-13 DIAGNOSIS — I672 Cerebral atherosclerosis: Secondary | ICD-10-CM | POA: Diagnosis not present

## 2021-05-13 DIAGNOSIS — E1140 Type 2 diabetes mellitus with diabetic neuropathy, unspecified: Secondary | ICD-10-CM | POA: Diagnosis not present

## 2021-05-13 DIAGNOSIS — E1169 Type 2 diabetes mellitus with other specified complication: Secondary | ICD-10-CM | POA: Diagnosis not present

## 2021-05-13 DIAGNOSIS — Z0000 Encounter for general adult medical examination without abnormal findings: Secondary | ICD-10-CM | POA: Diagnosis not present

## 2021-05-13 DIAGNOSIS — N183 Chronic kidney disease, stage 3 unspecified: Secondary | ICD-10-CM | POA: Diagnosis not present

## 2021-05-13 DIAGNOSIS — E7800 Pure hypercholesterolemia, unspecified: Secondary | ICD-10-CM | POA: Diagnosis not present

## 2021-05-13 MED ORDER — EZETIMIBE 10 MG PO TABS
10.0000 mg | ORAL_TABLET | Freq: Every day | ORAL | 0 refills | Status: DC
Start: 2021-05-13 — End: 2021-06-09
  Filled 2021-05-13: qty 30, 30d supply, fill #0

## 2021-05-16 ENCOUNTER — Other Ambulatory Visit (HOSPITAL_COMMUNITY): Payer: Self-pay

## 2021-05-17 ENCOUNTER — Other Ambulatory Visit (HOSPITAL_COMMUNITY): Payer: Self-pay

## 2021-05-31 ENCOUNTER — Other Ambulatory Visit (HOSPITAL_COMMUNITY): Payer: Self-pay

## 2021-05-31 DIAGNOSIS — N183 Chronic kidney disease, stage 3 unspecified: Secondary | ICD-10-CM | POA: Diagnosis not present

## 2021-05-31 DIAGNOSIS — E1169 Type 2 diabetes mellitus with other specified complication: Secondary | ICD-10-CM | POA: Diagnosis not present

## 2021-05-31 DIAGNOSIS — E7800 Pure hypercholesterolemia, unspecified: Secondary | ICD-10-CM | POA: Diagnosis not present

## 2021-05-31 DIAGNOSIS — I110 Hypertensive heart disease with heart failure: Secondary | ICD-10-CM | POA: Diagnosis not present

## 2021-05-31 DIAGNOSIS — M1990 Unspecified osteoarthritis, unspecified site: Secondary | ICD-10-CM | POA: Diagnosis not present

## 2021-06-08 ENCOUNTER — Other Ambulatory Visit (HOSPITAL_COMMUNITY): Payer: Self-pay

## 2021-06-09 ENCOUNTER — Other Ambulatory Visit (HOSPITAL_COMMUNITY): Payer: Self-pay

## 2021-06-09 MED ORDER — EZETIMIBE 10 MG PO TABS
10.0000 mg | ORAL_TABLET | Freq: Every day | ORAL | 4 refills | Status: DC
Start: 2021-06-09 — End: 2021-11-10
  Filled 2021-06-09: qty 30, 30d supply, fill #0
  Filled 2021-07-12: qty 30, 30d supply, fill #1
  Filled 2021-08-10: qty 30, 30d supply, fill #2
  Filled 2021-08-23 – 2021-09-12 (×3): qty 30, 30d supply, fill #3
  Filled 2021-10-10: qty 30, 30d supply, fill #4

## 2021-06-21 ENCOUNTER — Other Ambulatory Visit (HOSPITAL_COMMUNITY): Payer: Self-pay

## 2021-06-22 ENCOUNTER — Other Ambulatory Visit (HOSPITAL_COMMUNITY): Payer: Self-pay

## 2021-06-22 MED ORDER — INSULIN PEN NEEDLE 32G X 4 MM MISC
Freq: Every day | 3 refills | Status: AC
  Filled 2021-06-22 – 2021-09-19 (×2): qty 100, 100d supply, fill #0
  Filled 2021-12-28: qty 100, 100d supply, fill #1

## 2021-06-27 ENCOUNTER — Other Ambulatory Visit (HOSPITAL_COMMUNITY): Payer: Self-pay

## 2021-06-27 DIAGNOSIS — M1990 Unspecified osteoarthritis, unspecified site: Secondary | ICD-10-CM | POA: Diagnosis not present

## 2021-06-27 DIAGNOSIS — E1140 Type 2 diabetes mellitus with diabetic neuropathy, unspecified: Secondary | ICD-10-CM | POA: Diagnosis not present

## 2021-06-27 DIAGNOSIS — E7800 Pure hypercholesterolemia, unspecified: Secondary | ICD-10-CM | POA: Diagnosis not present

## 2021-06-27 DIAGNOSIS — I110 Hypertensive heart disease with heart failure: Secondary | ICD-10-CM | POA: Diagnosis not present

## 2021-06-27 DIAGNOSIS — E1169 Type 2 diabetes mellitus with other specified complication: Secondary | ICD-10-CM | POA: Diagnosis not present

## 2021-06-27 DIAGNOSIS — N183 Chronic kidney disease, stage 3 unspecified: Secondary | ICD-10-CM | POA: Diagnosis not present

## 2021-06-28 ENCOUNTER — Other Ambulatory Visit (HOSPITAL_COMMUNITY): Payer: Self-pay

## 2021-06-28 MED ORDER — INSULIN PEN NEEDLE 32G X 4 MM MISC
Freq: Every day | 3 refills | Status: DC
  Filled 2021-06-28: qty 100, 100d supply, fill #0

## 2021-07-12 ENCOUNTER — Other Ambulatory Visit (HOSPITAL_COMMUNITY): Payer: Self-pay

## 2021-07-13 ENCOUNTER — Other Ambulatory Visit (HOSPITAL_COMMUNITY): Payer: Self-pay

## 2021-07-19 ENCOUNTER — Other Ambulatory Visit (HOSPITAL_COMMUNITY): Payer: Self-pay

## 2021-07-21 ENCOUNTER — Other Ambulatory Visit (HOSPITAL_COMMUNITY): Payer: Self-pay

## 2021-07-21 MED ORDER — DILTIAZEM HCL ER 240 MG PO CP24
240.0000 mg | ORAL_CAPSULE | Freq: Every day | ORAL | 5 refills | Status: DC
  Filled 2021-07-21: qty 30, 30d supply, fill #0
  Filled 2021-08-16: qty 30, 30d supply, fill #1
  Filled 2021-09-19: qty 30, 30d supply, fill #2
  Filled 2021-10-20: qty 30, 30d supply, fill #3
  Filled 2021-11-16: qty 30, 30d supply, fill #4
  Filled 2021-12-13: qty 30, 30d supply, fill #5

## 2021-07-27 ENCOUNTER — Other Ambulatory Visit (HOSPITAL_COMMUNITY): Payer: Self-pay

## 2021-08-03 ENCOUNTER — Other Ambulatory Visit (HOSPITAL_COMMUNITY): Payer: Self-pay

## 2021-08-04 ENCOUNTER — Other Ambulatory Visit (HOSPITAL_COMMUNITY): Payer: Self-pay

## 2021-08-10 ENCOUNTER — Other Ambulatory Visit (HOSPITAL_COMMUNITY): Payer: Self-pay

## 2021-08-16 ENCOUNTER — Other Ambulatory Visit (HOSPITAL_COMMUNITY): Payer: Self-pay

## 2021-08-16 MED ORDER — PRAVASTATIN SODIUM 80 MG PO TABS
80.0000 mg | ORAL_TABLET | Freq: Every day | ORAL | 0 refills | Status: DC
  Filled 2021-08-16: qty 11, 11d supply, fill #0
  Filled 2021-08-16: qty 79, 79d supply, fill #0

## 2021-08-24 ENCOUNTER — Other Ambulatory Visit (HOSPITAL_COMMUNITY): Payer: Self-pay

## 2021-08-25 ENCOUNTER — Other Ambulatory Visit (HOSPITAL_COMMUNITY): Payer: Self-pay

## 2021-08-25 DIAGNOSIS — J208 Acute bronchitis due to other specified organisms: Secondary | ICD-10-CM | POA: Diagnosis not present

## 2021-08-25 MED ORDER — BENZONATATE 100 MG PO CAPS
100.0000 mg | ORAL_CAPSULE | Freq: Three times a day (TID) | ORAL | 0 refills | Status: DC | PRN
  Filled 2021-08-25: qty 30, 10d supply, fill #0

## 2021-08-25 MED ORDER — AZITHROMYCIN 250 MG PO TABS
ORAL_TABLET | ORAL | 0 refills | Status: DC
  Filled 2021-08-25: qty 6, 5d supply, fill #0

## 2021-08-29 DIAGNOSIS — E7800 Pure hypercholesterolemia, unspecified: Secondary | ICD-10-CM | POA: Diagnosis not present

## 2021-08-29 DIAGNOSIS — E1169 Type 2 diabetes mellitus with other specified complication: Secondary | ICD-10-CM | POA: Diagnosis not present

## 2021-08-29 DIAGNOSIS — M1990 Unspecified osteoarthritis, unspecified site: Secondary | ICD-10-CM | POA: Diagnosis not present

## 2021-08-29 DIAGNOSIS — I129 Hypertensive chronic kidney disease with stage 1 through stage 4 chronic kidney disease, or unspecified chronic kidney disease: Secondary | ICD-10-CM | POA: Diagnosis not present

## 2021-08-29 DIAGNOSIS — N183 Chronic kidney disease, stage 3 unspecified: Secondary | ICD-10-CM | POA: Diagnosis not present

## 2021-09-08 DIAGNOSIS — L578 Other skin changes due to chronic exposure to nonionizing radiation: Secondary | ICD-10-CM | POA: Diagnosis not present

## 2021-09-08 DIAGNOSIS — L821 Other seborrheic keratosis: Secondary | ICD-10-CM | POA: Diagnosis not present

## 2021-09-08 DIAGNOSIS — L570 Actinic keratosis: Secondary | ICD-10-CM | POA: Diagnosis not present

## 2021-09-08 DIAGNOSIS — L814 Other melanin hyperpigmentation: Secondary | ICD-10-CM | POA: Diagnosis not present

## 2021-09-08 DIAGNOSIS — D225 Melanocytic nevi of trunk: Secondary | ICD-10-CM | POA: Diagnosis not present

## 2021-09-12 ENCOUNTER — Other Ambulatory Visit (HOSPITAL_COMMUNITY): Payer: Self-pay

## 2021-09-13 DIAGNOSIS — E7800 Pure hypercholesterolemia, unspecified: Secondary | ICD-10-CM | POA: Diagnosis not present

## 2021-09-13 DIAGNOSIS — I129 Hypertensive chronic kidney disease with stage 1 through stage 4 chronic kidney disease, or unspecified chronic kidney disease: Secondary | ICD-10-CM | POA: Diagnosis not present

## 2021-09-13 DIAGNOSIS — E1169 Type 2 diabetes mellitus with other specified complication: Secondary | ICD-10-CM | POA: Diagnosis not present

## 2021-09-13 DIAGNOSIS — R059 Cough, unspecified: Secondary | ICD-10-CM | POA: Diagnosis not present

## 2021-09-13 DIAGNOSIS — R319 Hematuria, unspecified: Secondary | ICD-10-CM | POA: Diagnosis not present

## 2021-09-13 DIAGNOSIS — N183 Chronic kidney disease, stage 3 unspecified: Secondary | ICD-10-CM | POA: Diagnosis not present

## 2021-09-19 ENCOUNTER — Other Ambulatory Visit (HOSPITAL_COMMUNITY): Payer: Self-pay

## 2021-09-20 ENCOUNTER — Other Ambulatory Visit (HOSPITAL_COMMUNITY): Payer: Self-pay

## 2021-09-20 MED ORDER — LOSARTAN POTASSIUM 25 MG PO TABS
25.0000 mg | ORAL_TABLET | Freq: Every day | ORAL | 5 refills | Status: DC
  Filled 2021-09-20: qty 30, 30d supply, fill #0
  Filled 2021-10-20: qty 30, 30d supply, fill #1
  Filled 2021-11-16: qty 30, 30d supply, fill #2
  Filled 2021-12-13: qty 30, 30d supply, fill #3
  Filled 2022-01-12: qty 30, 30d supply, fill #4
  Filled 2022-02-09: qty 30, 30d supply, fill #5

## 2021-10-04 ENCOUNTER — Telehealth: Payer: Self-pay

## 2021-10-04 NOTE — Patient Outreach (Signed)
  Care Coordination   10/04/2021 Name: Emma Duran MRN: [id number] DOB: Jan 27, 1955   Care Coordination Outreach Attempts:  An unsuccessful telephone outreach was attempted today to offer the patient information about available care coordination services as a benefit of their health plan.   Follow Up Plan:  Additional outreach attempts will be made to offer the patient care coordination information and services.   Encounter Outcome:  No Answer  Care Coordination Interventions Activated:  No   Care Coordination Interventions:  No, not indicated    [facility] [phone]

## 2021-10-10 ENCOUNTER — Other Ambulatory Visit (HOSPITAL_COMMUNITY): Payer: Self-pay

## 2021-10-11 DIAGNOSIS — D631 Anemia in chronic kidney disease: Secondary | ICD-10-CM | POA: Diagnosis not present

## 2021-10-11 DIAGNOSIS — N179 Acute kidney failure, unspecified: Secondary | ICD-10-CM | POA: Diagnosis not present

## 2021-10-11 DIAGNOSIS — N183 Chronic kidney disease, stage 3 unspecified: Secondary | ICD-10-CM | POA: Diagnosis not present

## 2021-10-11 DIAGNOSIS — E1122 Type 2 diabetes mellitus with diabetic chronic kidney disease: Secondary | ICD-10-CM | POA: Diagnosis not present

## 2021-10-11 DIAGNOSIS — I639 Cerebral infarction, unspecified: Secondary | ICD-10-CM | POA: Diagnosis not present

## 2021-10-11 DIAGNOSIS — M1990 Unspecified osteoarthritis, unspecified site: Secondary | ICD-10-CM | POA: Diagnosis not present

## 2021-10-11 DIAGNOSIS — I129 Hypertensive chronic kidney disease with stage 1 through stage 4 chronic kidney disease, or unspecified chronic kidney disease: Secondary | ICD-10-CM | POA: Diagnosis not present

## 2021-10-12 ENCOUNTER — Telehealth: Payer: Self-pay

## 2021-10-12 NOTE — Patient Outreach (Signed)
  Care Coordination   10/12/2021 Name: Emma Duran MRN: [id number] DOB: 02-14-1955   Care Coordination Outreach Attempts:  A second unsuccessful outreach was attempted today to offer the patient with information about available care coordination services as a benefit of their health plan.     Follow Up Plan:  Additional outreach attempts will be made to offer the patient care coordination information and services.   Encounter Outcome:  No Answer  Care Coordination Interventions Activated:  No   Care Coordination Interventions:  No, not indicated    [facility] [phone]

## 2021-10-20 ENCOUNTER — Other Ambulatory Visit (HOSPITAL_COMMUNITY): Payer: Self-pay

## 2021-10-21 DIAGNOSIS — Z1231 Encounter for screening mammogram for malignant neoplasm of breast: Secondary | ICD-10-CM | POA: Diagnosis not present

## 2021-11-06 ENCOUNTER — Other Ambulatory Visit (HOSPITAL_COMMUNITY): Payer: Self-pay

## 2021-11-07 ENCOUNTER — Other Ambulatory Visit (HOSPITAL_COMMUNITY): Payer: Self-pay

## 2021-11-07 MED ORDER — CLOPIDOGREL BISULFATE 75 MG PO TABS
75.0000 mg | ORAL_TABLET | Freq: Every day | ORAL | 0 refills | Status: DC
  Filled 2021-11-07: qty 90, 90d supply, fill #0

## 2021-11-07 MED ORDER — CLOPIDOGREL BISULFATE 75 MG PO TABS
75.0000 mg | ORAL_TABLET | Freq: Every day | ORAL | 3 refills | Status: DC
  Filled 2021-11-07 – 2022-01-29 (×2): qty 90, 90d supply, fill #0
  Filled 2022-05-01: qty 90, 90d supply, fill #1

## 2021-11-10 ENCOUNTER — Other Ambulatory Visit (HOSPITAL_COMMUNITY): Payer: Self-pay

## 2021-11-10 MED ORDER — EZETIMIBE 10 MG PO TABS
10.0000 mg | ORAL_TABLET | Freq: Every day | ORAL | 4 refills | Status: DC
  Filled 2021-11-10: qty 30, 30d supply, fill #0
  Filled 2021-12-13: qty 30, 30d supply, fill #1
  Filled 2022-01-11: qty 30, 30d supply, fill #2
  Filled 2022-02-09: qty 30, 30d supply, fill #3
  Filled 2022-03-15: qty 30, 30d supply, fill #4

## 2021-11-10 MED ORDER — PRAVASTATIN SODIUM 80 MG PO TABS
80.0000 mg | ORAL_TABLET | Freq: Every day | ORAL | 0 refills | Status: DC
  Filled 2021-11-10: qty 90, 90d supply, fill #0

## 2021-11-11 ENCOUNTER — Other Ambulatory Visit (HOSPITAL_COMMUNITY): Payer: Self-pay

## 2021-11-16 ENCOUNTER — Other Ambulatory Visit (HOSPITAL_COMMUNITY): Payer: Self-pay

## 2021-11-29 ENCOUNTER — Other Ambulatory Visit (HOSPITAL_COMMUNITY): Payer: Self-pay

## 2021-11-30 DIAGNOSIS — E7800 Pure hypercholesterolemia, unspecified: Secondary | ICD-10-CM | POA: Diagnosis not present

## 2021-11-30 DIAGNOSIS — I129 Hypertensive chronic kidney disease with stage 1 through stage 4 chronic kidney disease, or unspecified chronic kidney disease: Secondary | ICD-10-CM | POA: Diagnosis not present

## 2021-11-30 DIAGNOSIS — E1169 Type 2 diabetes mellitus with other specified complication: Secondary | ICD-10-CM | POA: Diagnosis not present

## 2021-11-30 DIAGNOSIS — N183 Chronic kidney disease, stage 3 unspecified: Secondary | ICD-10-CM | POA: Diagnosis not present

## 2021-12-01 ENCOUNTER — Other Ambulatory Visit (HOSPITAL_COMMUNITY): Payer: Self-pay

## 2021-12-01 MED ORDER — ACCU-CHEK GUIDE VI STRP
ORAL_STRIP | 3 refills | Status: DC
  Filled 2021-12-01: qty 200, 100d supply, fill #0

## 2021-12-13 ENCOUNTER — Other Ambulatory Visit (HOSPITAL_COMMUNITY): Payer: Self-pay

## 2021-12-13 ENCOUNTER — Other Ambulatory Visit: Payer: Self-pay

## 2021-12-22 DIAGNOSIS — E7800 Pure hypercholesterolemia, unspecified: Secondary | ICD-10-CM | POA: Diagnosis not present

## 2021-12-22 DIAGNOSIS — N183 Chronic kidney disease, stage 3 unspecified: Secondary | ICD-10-CM | POA: Diagnosis not present

## 2021-12-22 DIAGNOSIS — M1990 Unspecified osteoarthritis, unspecified site: Secondary | ICD-10-CM | POA: Diagnosis not present

## 2021-12-22 DIAGNOSIS — E1169 Type 2 diabetes mellitus with other specified complication: Secondary | ICD-10-CM | POA: Diagnosis not present

## 2021-12-22 DIAGNOSIS — I129 Hypertensive chronic kidney disease with stage 1 through stage 4 chronic kidney disease, or unspecified chronic kidney disease: Secondary | ICD-10-CM | POA: Diagnosis not present

## 2021-12-28 ENCOUNTER — Other Ambulatory Visit: Payer: Self-pay

## 2022-01-12 ENCOUNTER — Other Ambulatory Visit (HOSPITAL_COMMUNITY): Payer: Self-pay

## 2022-01-12 ENCOUNTER — Other Ambulatory Visit: Payer: Self-pay

## 2022-01-12 MED ORDER — DILTIAZEM HCL ER 240 MG PO CP24
240.0000 mg | ORAL_CAPSULE | Freq: Every day | ORAL | 5 refills | Status: DC
  Filled 2022-01-12: qty 30, 30d supply, fill #0
  Filled 2022-02-09: qty 30, 30d supply, fill #1
  Filled 2022-03-15: qty 30, 30d supply, fill #2
  Filled 2022-04-11: qty 30, 30d supply, fill #3
  Filled 2022-05-11: qty 30, 30d supply, fill #4
  Filled 2022-06-12: qty 30, 30d supply, fill #5

## 2022-01-13 DIAGNOSIS — I129 Hypertensive chronic kidney disease with stage 1 through stage 4 chronic kidney disease, or unspecified chronic kidney disease: Secondary | ICD-10-CM | POA: Diagnosis not present

## 2022-01-13 DIAGNOSIS — E1169 Type 2 diabetes mellitus with other specified complication: Secondary | ICD-10-CM | POA: Diagnosis not present

## 2022-01-13 DIAGNOSIS — N183 Chronic kidney disease, stage 3 unspecified: Secondary | ICD-10-CM | POA: Diagnosis not present

## 2022-01-13 DIAGNOSIS — E7800 Pure hypercholesterolemia, unspecified: Secondary | ICD-10-CM | POA: Diagnosis not present

## 2022-01-23 DIAGNOSIS — E1169 Type 2 diabetes mellitus with other specified complication: Secondary | ICD-10-CM | POA: Diagnosis not present

## 2022-01-29 ENCOUNTER — Other Ambulatory Visit (HOSPITAL_COMMUNITY): Payer: Self-pay

## 2022-01-30 ENCOUNTER — Other Ambulatory Visit (HOSPITAL_COMMUNITY): Payer: Self-pay

## 2022-01-30 ENCOUNTER — Other Ambulatory Visit: Payer: Self-pay

## 2022-01-30 MED ORDER — PRAVASTATIN SODIUM 80 MG PO TABS
80.0000 mg | ORAL_TABLET | Freq: Every day | ORAL | 1 refills | Status: DC
  Filled 2022-01-30: qty 90, 90d supply, fill #0
  Filled 2022-05-11: qty 90, 90d supply, fill #1

## 2022-02-09 ENCOUNTER — Other Ambulatory Visit (HOSPITAL_COMMUNITY): Payer: Self-pay

## 2022-02-09 MED ORDER — DAPAGLIFLOZIN PROPANEDIOL 5 MG PO TABS
5.0000 mg | ORAL_TABLET | Freq: Every day | ORAL | 1 refills | Status: DC
  Filled 2022-02-09: qty 30, 30d supply, fill #0
  Filled 2022-03-06: qty 30, 30d supply, fill #1

## 2022-02-10 ENCOUNTER — Other Ambulatory Visit: Payer: Self-pay

## 2022-02-10 ENCOUNTER — Other Ambulatory Visit (HOSPITAL_COMMUNITY): Payer: Self-pay

## 2022-02-23 DIAGNOSIS — E1169 Type 2 diabetes mellitus with other specified complication: Secondary | ICD-10-CM | POA: Diagnosis not present

## 2022-03-15 ENCOUNTER — Other Ambulatory Visit (HOSPITAL_COMMUNITY): Payer: Self-pay

## 2022-03-16 ENCOUNTER — Other Ambulatory Visit: Payer: Self-pay

## 2022-03-16 ENCOUNTER — Other Ambulatory Visit (HOSPITAL_COMMUNITY): Payer: Self-pay

## 2022-03-16 MED ORDER — LOSARTAN POTASSIUM 25 MG PO TABS
25.0000 mg | ORAL_TABLET | Freq: Every day | ORAL | 5 refills | Status: DC
  Filled 2022-03-16: qty 30, 30d supply, fill #0
  Filled 2022-04-11: qty 30, 30d supply, fill #1
  Filled 2022-05-11: qty 30, 30d supply, fill #2
  Filled 2022-06-12: qty 30, 30d supply, fill #3
  Filled 2022-07-18: qty 30, 30d supply, fill #4
  Filled 2022-08-16: qty 30, 30d supply, fill #5

## 2022-03-24 DIAGNOSIS — E1169 Type 2 diabetes mellitus with other specified complication: Secondary | ICD-10-CM | POA: Diagnosis not present

## 2022-04-04 ENCOUNTER — Other Ambulatory Visit (HOSPITAL_COMMUNITY): Payer: Self-pay

## 2022-04-04 MED ORDER — DAPAGLIFLOZIN PROPANEDIOL 5 MG PO TABS
5.0000 mg | ORAL_TABLET | Freq: Every day | ORAL | 1 refills | Status: DC
  Filled 2022-04-04: qty 30, 30d supply, fill #0
  Filled 2022-05-01: qty 30, 30d supply, fill #1

## 2022-04-11 ENCOUNTER — Other Ambulatory Visit (HOSPITAL_COMMUNITY): Payer: Self-pay

## 2022-04-11 ENCOUNTER — Other Ambulatory Visit: Payer: Self-pay

## 2022-04-12 ENCOUNTER — Other Ambulatory Visit (HOSPITAL_COMMUNITY): Payer: Self-pay

## 2022-04-13 ENCOUNTER — Other Ambulatory Visit (HOSPITAL_COMMUNITY): Payer: Self-pay

## 2022-04-13 MED ORDER — EZETIMIBE 10 MG PO TABS
10.0000 mg | ORAL_TABLET | Freq: Every day | ORAL | 2 refills | Status: DC
  Filled 2022-04-13 – 2022-07-18 (×2): qty 30, 30d supply, fill #0

## 2022-04-13 MED ORDER — EZETIMIBE 10 MG PO TABS
10.0000 mg | ORAL_TABLET | Freq: Every day | ORAL | 2 refills | Status: DC
  Filled 2022-04-13: qty 30, 30d supply, fill #0
  Filled 2022-05-11: qty 30, 30d supply, fill #1
  Filled 2022-06-12: qty 30, 30d supply, fill #2

## 2022-04-23 DIAGNOSIS — E1169 Type 2 diabetes mellitus with other specified complication: Secondary | ICD-10-CM | POA: Diagnosis not present

## 2022-05-02 ENCOUNTER — Ambulatory Visit: Payer: Medicare Other | Admitting: Podiatry

## 2022-05-02 DIAGNOSIS — E119 Type 2 diabetes mellitus without complications: Secondary | ICD-10-CM | POA: Diagnosis not present

## 2022-05-02 DIAGNOSIS — M79675 Pain in left toe(s): Secondary | ICD-10-CM | POA: Diagnosis not present

## 2022-05-02 DIAGNOSIS — B351 Tinea unguium: Secondary | ICD-10-CM | POA: Diagnosis not present

## 2022-05-02 DIAGNOSIS — M79674 Pain in right toe(s): Secondary | ICD-10-CM | POA: Diagnosis not present

## 2022-05-02 NOTE — Progress Notes (Signed)
   Chief Complaint  Patient presents with   Diabetes    Patient came in today for diabetic foot care, nail trim, A1c- 9.3, BG- 112    SUBJECTIVE Patient with a history of diabetes mellitus presents to office today complaining of elongated, thickened nails that cause pain while ambulating in shoes.  Patient is unable to trim their own nails. Patient is here for further evaluation and treatment.  Past Medical History:  Diagnosis Date   Abnormal breast exam    chip in right breast as marker for abnormality    Arthritis    knee, hands   Diabetes mellitus    type 2   External hemorrhoids    GERD (gastroesophageal reflux disease)    Heart murmur    never has caused any problems   History of colon polyps    History of kidney stones    surgery done   Hyperlipidemia    Hypertension    Internal hemorrhoids    Stroke (HCC)     Allergies  Allergen Reactions   Omeprazole Hives and Itching    Full body hives and itching    Percocet [Oxycodone-Acetaminophen] Other (See Comments)    Patient states "Pain med doesn't work"   Tramadol     Ineffective    Morphine Anxiety    Hallucinations      OBJECTIVE General Patient is awake, alert, and oriented x 3 and in no acute distress. Derm Skin is dry and supple bilateral. Negative open lesions or macerations. Remaining integument unremarkable. Nails are tender, long, thickened and dystrophic with subungual debris, consistent with onychomycosis, 1-5 bilateral. No signs of infection noted. Vasc  DP and PT pedal pulses palpable bilaterally. Temperature gradient within normal limits.  Neuro Epicritic and protective threshold sensation diminished bilaterally.  Musculoskeletal Exam No symptomatic pedal deformities noted bilateral. Muscular strength within normal limits.  ASSESSMENT 1. Diabetes Mellitus w/ peripheral neuropathy 2.  Pain due to onychomycosis of toenails bilateral  PLAN OF CARE 1. Patient evaluated today.  Comprehensive diabetic  foot exam performed today 2. Instructed to maintain good pedal hygiene and foot care. Stressed importance of controlling blood sugar.  3. Mechanical debridement of nails 1-5 bilaterally performed using a nail nipper. Filed with dremel without incident.  4. Return to clinic in 3 mos.     Felecia Shelling, DPM [facility]  Dr. Felecia Shelling, DPM    [address] [place], [place] [address]                Office [phone]  Fax [phone]

## 2022-05-22 ENCOUNTER — Other Ambulatory Visit (HOSPITAL_COMMUNITY): Payer: Self-pay

## 2022-05-22 DIAGNOSIS — Z794 Long term (current) use of insulin: Secondary | ICD-10-CM | POA: Diagnosis not present

## 2022-05-22 DIAGNOSIS — I110 Hypertensive heart disease with heart failure: Secondary | ICD-10-CM | POA: Diagnosis not present

## 2022-05-22 DIAGNOSIS — I5030 Unspecified diastolic (congestive) heart failure: Secondary | ICD-10-CM | POA: Diagnosis not present

## 2022-05-22 DIAGNOSIS — E1122 Type 2 diabetes mellitus with diabetic chronic kidney disease: Secondary | ICD-10-CM | POA: Diagnosis not present

## 2022-05-22 DIAGNOSIS — Z23 Encounter for immunization: Secondary | ICD-10-CM | POA: Diagnosis not present

## 2022-05-22 DIAGNOSIS — R059 Cough, unspecified: Secondary | ICD-10-CM | POA: Diagnosis not present

## 2022-05-22 DIAGNOSIS — I4510 Unspecified right bundle-branch block: Secondary | ICD-10-CM | POA: Diagnosis not present

## 2022-05-22 DIAGNOSIS — E1169 Type 2 diabetes mellitus with other specified complication: Secondary | ICD-10-CM | POA: Diagnosis not present

## 2022-05-22 DIAGNOSIS — N183 Chronic kidney disease, stage 3 unspecified: Secondary | ICD-10-CM | POA: Diagnosis not present

## 2022-05-22 DIAGNOSIS — E7800 Pure hypercholesterolemia, unspecified: Secondary | ICD-10-CM | POA: Diagnosis not present

## 2022-05-22 DIAGNOSIS — E1140 Type 2 diabetes mellitus with diabetic neuropathy, unspecified: Secondary | ICD-10-CM | POA: Diagnosis not present

## 2022-05-22 DIAGNOSIS — Z0000 Encounter for general adult medical examination without abnormal findings: Secondary | ICD-10-CM | POA: Diagnosis not present

## 2022-05-22 MED ORDER — AZITHROMYCIN 250 MG PO TABS
ORAL_TABLET | ORAL | 0 refills | Status: DC
  Filled 2022-05-22: qty 6, 5d supply, fill #0

## 2022-05-23 DIAGNOSIS — E1169 Type 2 diabetes mellitus with other specified complication: Secondary | ICD-10-CM | POA: Diagnosis not present

## 2022-06-07 ENCOUNTER — Other Ambulatory Visit (HOSPITAL_COMMUNITY): Payer: Self-pay

## 2022-06-07 DIAGNOSIS — E1169 Type 2 diabetes mellitus with other specified complication: Secondary | ICD-10-CM | POA: Diagnosis not present

## 2022-06-07 DIAGNOSIS — I129 Hypertensive chronic kidney disease with stage 1 through stage 4 chronic kidney disease, or unspecified chronic kidney disease: Secondary | ICD-10-CM | POA: Diagnosis not present

## 2022-06-07 DIAGNOSIS — E7800 Pure hypercholesterolemia, unspecified: Secondary | ICD-10-CM | POA: Diagnosis not present

## 2022-06-07 DIAGNOSIS — N183 Chronic kidney disease, stage 3 unspecified: Secondary | ICD-10-CM | POA: Diagnosis not present

## 2022-06-07 MED ORDER — DAPAGLIFLOZIN PROPANEDIOL 10 MG PO TABS
10.0000 mg | ORAL_TABLET | Freq: Every day | ORAL | 3 refills | Status: DC
  Filled 2022-06-07 – 2022-06-08 (×2): qty 30, 30d supply, fill #0
  Filled 2022-07-30: qty 30, 30d supply, fill #1
  Filled 2022-09-18: qty 30, 30d supply, fill #2
  Filled 2023-02-15 – 2023-03-16 (×2): qty 30, 30d supply, fill #3
  Filled 2023-05-14: qty 30, 30d supply, fill #4

## 2022-06-08 ENCOUNTER — Other Ambulatory Visit (HOSPITAL_COMMUNITY): Payer: Self-pay

## 2022-06-23 DIAGNOSIS — E1169 Type 2 diabetes mellitus with other specified complication: Secondary | ICD-10-CM | POA: Diagnosis not present

## 2022-07-18 ENCOUNTER — Other Ambulatory Visit (HOSPITAL_COMMUNITY): Payer: Self-pay

## 2022-07-19 ENCOUNTER — Other Ambulatory Visit: Payer: Self-pay

## 2022-07-19 ENCOUNTER — Other Ambulatory Visit (HOSPITAL_COMMUNITY): Payer: Self-pay

## 2022-07-19 MED ORDER — PRAVASTATIN SODIUM 80 MG PO TABS
80.0000 mg | ORAL_TABLET | Freq: Every day | ORAL | 1 refills | Status: DC
  Filled 2022-07-19: qty 90, 90d supply, fill #0
  Filled 2022-10-30: qty 90, 90d supply, fill #1

## 2022-07-19 MED ORDER — DILTIAZEM HCL ER 240 MG PO CP24
240.0000 mg | ORAL_CAPSULE | Freq: Every day | ORAL | 5 refills | Status: DC
  Filled 2022-07-19: qty 30, 30d supply, fill #0
  Filled 2022-08-16: qty 30, 30d supply, fill #1
  Filled 2022-09-13: qty 30, 30d supply, fill #2
  Filled 2022-10-16: qty 30, 30d supply, fill #3
  Filled 2022-11-20: qty 30, 30d supply, fill #4
  Filled 2022-12-17: qty 30, 30d supply, fill #5

## 2022-07-23 DIAGNOSIS — E1169 Type 2 diabetes mellitus with other specified complication: Secondary | ICD-10-CM | POA: Diagnosis not present

## 2022-07-30 ENCOUNTER — Other Ambulatory Visit (HOSPITAL_COMMUNITY): Payer: Self-pay

## 2022-07-31 ENCOUNTER — Other Ambulatory Visit (HOSPITAL_COMMUNITY): Payer: Self-pay

## 2022-07-31 ENCOUNTER — Other Ambulatory Visit: Payer: Self-pay

## 2022-07-31 MED ORDER — CLOPIDOGREL BISULFATE 75 MG PO TABS
75.0000 mg | ORAL_TABLET | Freq: Every day | ORAL | 0 refills | Status: DC
  Filled 2022-07-31: qty 90, 90d supply, fill #0

## 2022-08-01 ENCOUNTER — Ambulatory Visit: Payer: Medicare Other | Admitting: Podiatry

## 2022-08-01 ENCOUNTER — Encounter: Payer: Self-pay | Admitting: Podiatry

## 2022-08-01 DIAGNOSIS — Z794 Long term (current) use of insulin: Secondary | ICD-10-CM | POA: Diagnosis not present

## 2022-08-01 DIAGNOSIS — M79674 Pain in right toe(s): Secondary | ICD-10-CM

## 2022-08-01 DIAGNOSIS — E1142 Type 2 diabetes mellitus with diabetic polyneuropathy: Secondary | ICD-10-CM

## 2022-08-01 DIAGNOSIS — B351 Tinea unguium: Secondary | ICD-10-CM | POA: Diagnosis not present

## 2022-08-01 DIAGNOSIS — M79675 Pain in left toe(s): Secondary | ICD-10-CM

## 2022-08-01 NOTE — Progress Notes (Signed)
This patient returns to my office for at risk foot care.  This patient requires this care by a professional since this patient will be at risk due to having diabetic neuropathy.  This patient is unable to cut nails herself since the patient cannot reach her nails.These nails are painful walking and wearing shoes.  This patient presents for at risk foot care today.  General Appearance  Alert, conversant and in no acute stress.  Vascular  Dorsalis pedis and posterior tibial  pulses are palpable  bilaterally.  Capillary return is within normal limits  bilaterally. Temperature is within normal limits  bilaterally.  Neurologic  Senn-Weinstein monofilament wire test diminished   bilaterally. Muscle power within normal limits bilaterally.  Nails Thick disfigured discolored nails with subungual debris  from hallux to fifth toes bilaterally. No evidence of bacterial infection or drainage bilaterally.  Orthopedic  No limitations of motion  feet .  No crepitus or effusions noted.  No bony pathology or digital deformities noted.  HAV  B/L.  Skin  normotropic skin with no porokeratosis noted bilaterally.  No signs of infections or ulcers noted.     Onychomycosis  Pain in right toes  Pain in left toes  Consent was obtained for treatment procedures.   Mechanical debridement of nails 1-5  bilaterally performed with a nail nipper.  Filed with dremel without incident.    Return office visit   3 months                   Told patient to return for periodic foot care and evaluation due to potential at risk complications.   Helane Gunther DPM

## 2022-08-16 ENCOUNTER — Other Ambulatory Visit (HOSPITAL_COMMUNITY): Payer: Self-pay

## 2022-08-16 ENCOUNTER — Other Ambulatory Visit: Payer: Self-pay

## 2022-08-16 MED ORDER — EZETIMIBE 10 MG PO TABS
10.0000 mg | ORAL_TABLET | Freq: Every day | ORAL | 2 refills | Status: DC
  Filled 2022-08-16: qty 30, 30d supply, fill #0
  Filled 2022-09-13: qty 30, 30d supply, fill #1
  Filled 2022-10-16: qty 30, 30d supply, fill #2

## 2022-08-22 DIAGNOSIS — E1169 Type 2 diabetes mellitus with other specified complication: Secondary | ICD-10-CM | POA: Diagnosis not present

## 2022-09-11 DIAGNOSIS — L814 Other melanin hyperpigmentation: Secondary | ICD-10-CM | POA: Diagnosis not present

## 2022-09-11 DIAGNOSIS — D225 Melanocytic nevi of trunk: Secondary | ICD-10-CM | POA: Diagnosis not present

## 2022-09-11 DIAGNOSIS — L570 Actinic keratosis: Secondary | ICD-10-CM | POA: Diagnosis not present

## 2022-09-11 DIAGNOSIS — L821 Other seborrheic keratosis: Secondary | ICD-10-CM | POA: Diagnosis not present

## 2022-09-13 ENCOUNTER — Other Ambulatory Visit: Payer: Self-pay

## 2022-09-13 ENCOUNTER — Other Ambulatory Visit (HOSPITAL_COMMUNITY): Payer: Self-pay

## 2022-09-13 MED ORDER — LOSARTAN POTASSIUM 25 MG PO TABS
25.0000 mg | ORAL_TABLET | Freq: Every day | ORAL | 5 refills | Status: DC
  Filled 2022-09-13: qty 30, 30d supply, fill #0
  Filled 2022-10-16: qty 30, 30d supply, fill #1
  Filled 2022-11-20: qty 30, 30d supply, fill #2
  Filled 2022-12-17: qty 30, 30d supply, fill #3
  Filled 2023-01-18: qty 30, 30d supply, fill #4
  Filled 2023-02-15: qty 30, 30d supply, fill #5

## 2022-09-21 DIAGNOSIS — E1122 Type 2 diabetes mellitus with diabetic chronic kidney disease: Secondary | ICD-10-CM | POA: Diagnosis not present

## 2022-09-21 DIAGNOSIS — E7800 Pure hypercholesterolemia, unspecified: Secondary | ICD-10-CM | POA: Diagnosis not present

## 2022-09-21 DIAGNOSIS — Z794 Long term (current) use of insulin: Secondary | ICD-10-CM | POA: Diagnosis not present

## 2022-09-21 DIAGNOSIS — I129 Hypertensive chronic kidney disease with stage 1 through stage 4 chronic kidney disease, or unspecified chronic kidney disease: Secondary | ICD-10-CM | POA: Diagnosis not present

## 2022-09-21 DIAGNOSIS — N183 Chronic kidney disease, stage 3 unspecified: Secondary | ICD-10-CM | POA: Diagnosis not present

## 2022-10-01 DIAGNOSIS — E1169 Type 2 diabetes mellitus with other specified complication: Secondary | ICD-10-CM | POA: Diagnosis not present

## 2022-10-04 DIAGNOSIS — E1169 Type 2 diabetes mellitus with other specified complication: Secondary | ICD-10-CM | POA: Diagnosis not present

## 2022-10-16 ENCOUNTER — Other Ambulatory Visit: Payer: Self-pay

## 2022-10-16 ENCOUNTER — Other Ambulatory Visit (HOSPITAL_COMMUNITY): Payer: Self-pay

## 2022-10-16 MED ORDER — CLOPIDOGREL BISULFATE 75 MG PO TABS
75.0000 mg | ORAL_TABLET | Freq: Every day | ORAL | 0 refills | Status: DC
  Filled 2022-10-16: qty 90, 90d supply, fill #0

## 2022-10-17 DIAGNOSIS — M1990 Unspecified osteoarthritis, unspecified site: Secondary | ICD-10-CM | POA: Diagnosis not present

## 2022-10-17 DIAGNOSIS — I639 Cerebral infarction, unspecified: Secondary | ICD-10-CM | POA: Diagnosis not present

## 2022-10-17 DIAGNOSIS — N179 Acute kidney failure, unspecified: Secondary | ICD-10-CM | POA: Diagnosis not present

## 2022-10-17 DIAGNOSIS — E1122 Type 2 diabetes mellitus with diabetic chronic kidney disease: Secondary | ICD-10-CM | POA: Diagnosis not present

## 2022-10-17 DIAGNOSIS — I129 Hypertensive chronic kidney disease with stage 1 through stage 4 chronic kidney disease, or unspecified chronic kidney disease: Secondary | ICD-10-CM | POA: Diagnosis not present

## 2022-10-17 DIAGNOSIS — D631 Anemia in chronic kidney disease: Secondary | ICD-10-CM | POA: Diagnosis not present

## 2022-10-17 DIAGNOSIS — N183 Chronic kidney disease, stage 3 unspecified: Secondary | ICD-10-CM | POA: Diagnosis not present

## 2022-10-24 DIAGNOSIS — N6320 Unspecified lump in the left breast, unspecified quadrant: Secondary | ICD-10-CM | POA: Diagnosis not present

## 2022-10-24 DIAGNOSIS — N6321 Unspecified lump in the left breast, upper outer quadrant: Secondary | ICD-10-CM | POA: Diagnosis not present

## 2022-11-01 ENCOUNTER — Ambulatory Visit: Payer: Medicare Other | Admitting: Podiatry

## 2022-11-01 ENCOUNTER — Other Ambulatory Visit: Payer: Self-pay

## 2022-11-01 DIAGNOSIS — N6012 Diffuse cystic mastopathy of left breast: Secondary | ICD-10-CM | POA: Diagnosis not present

## 2022-11-01 DIAGNOSIS — N6322 Unspecified lump in the left breast, upper inner quadrant: Secondary | ICD-10-CM | POA: Diagnosis not present

## 2022-11-01 DIAGNOSIS — R928 Other abnormal and inconclusive findings on diagnostic imaging of breast: Secondary | ICD-10-CM | POA: Diagnosis not present

## 2022-11-02 ENCOUNTER — Encounter: Payer: Self-pay | Admitting: Podiatry

## 2022-11-02 ENCOUNTER — Ambulatory Visit (INDEPENDENT_AMBULATORY_CARE_PROVIDER_SITE_OTHER): Payer: Medicare Other | Admitting: Podiatry

## 2022-11-02 DIAGNOSIS — Z794 Long term (current) use of insulin: Secondary | ICD-10-CM | POA: Diagnosis not present

## 2022-11-02 DIAGNOSIS — E1142 Type 2 diabetes mellitus with diabetic polyneuropathy: Secondary | ICD-10-CM | POA: Diagnosis not present

## 2022-11-02 DIAGNOSIS — B351 Tinea unguium: Secondary | ICD-10-CM | POA: Diagnosis not present

## 2022-11-02 DIAGNOSIS — M79675 Pain in left toe(s): Secondary | ICD-10-CM

## 2022-11-02 DIAGNOSIS — M79674 Pain in right toe(s): Secondary | ICD-10-CM | POA: Diagnosis not present

## 2022-11-02 LAB — SURGICAL PATHOLOGY

## 2022-11-02 NOTE — Progress Notes (Signed)
This patient returns to my office for at risk foot care.  This patient requires this care by a professional since this patient will be at risk due to having diabetic neuropathy.  This patient is unable to cut nails herself since the patient cannot reach her nails.These nails are painful walking and wearing shoes.  This patient presents for at risk foot care today.  General Appearance  Alert, conversant and in no acute stress.  Vascular  Dorsalis pedis and posterior tibial  pulses are palpable  bilaterally.  Capillary return is within normal limits  bilaterally. Temperature is within normal limits  bilaterally.  Neurologic  Senn-Weinstein monofilament wire test diminished   bilaterally. Muscle power within normal limits bilaterally.  Nails Thick disfigured discolored nails with subungual debris  from hallux to fifth toes bilaterally. No evidence of bacterial infection or drainage bilaterally.  Orthopedic  No limitations of motion  feet .  No crepitus or effusions noted.  No bony pathology or digital deformities noted.  HAV  B/L.  Skin  normotropic skin with no porokeratosis noted bilaterally.  No signs of infections or ulcers noted.     Onychomycosis  Pain in right toes  Pain in left toes  Consent was obtained for treatment procedures.   Mechanical debridement of nails 1-5  bilaterally performed with a nail nipper.  Filed with dremel without incident.    Return office visit   3 months                   Told patient to return for periodic foot care and evaluation due to potential at risk complications.   Helane Gunther DPM

## 2022-11-20 ENCOUNTER — Other Ambulatory Visit (HOSPITAL_COMMUNITY): Payer: Self-pay

## 2022-11-20 ENCOUNTER — Other Ambulatory Visit: Payer: Self-pay

## 2022-11-20 MED ORDER — EZETIMIBE 10 MG PO TABS
10.0000 mg | ORAL_TABLET | Freq: Every day | ORAL | 6 refills | Status: DC
  Filled 2022-11-20: qty 30, 30d supply, fill #0
  Filled 2022-12-17: qty 30, 30d supply, fill #1
  Filled 2023-01-18: qty 30, 30d supply, fill #2
  Filled 2023-02-15: qty 30, 30d supply, fill #3
  Filled 2023-03-15: qty 30, 30d supply, fill #4
  Filled 2023-04-16: qty 30, 30d supply, fill #5
  Filled 2023-05-14: qty 30, 30d supply, fill #6

## 2022-12-29 DIAGNOSIS — H524 Presbyopia: Secondary | ICD-10-CM | POA: Diagnosis not present

## 2022-12-29 DIAGNOSIS — H2513 Age-related nuclear cataract, bilateral: Secondary | ICD-10-CM | POA: Diagnosis not present

## 2022-12-29 DIAGNOSIS — E119 Type 2 diabetes mellitus without complications: Secondary | ICD-10-CM | POA: Diagnosis not present

## 2022-12-29 DIAGNOSIS — H43811 Vitreous degeneration, right eye: Secondary | ICD-10-CM | POA: Diagnosis not present

## 2022-12-31 DIAGNOSIS — E1169 Type 2 diabetes mellitus with other specified complication: Secondary | ICD-10-CM | POA: Diagnosis not present

## 2023-01-18 ENCOUNTER — Other Ambulatory Visit (HOSPITAL_COMMUNITY): Payer: Self-pay

## 2023-01-18 ENCOUNTER — Other Ambulatory Visit: Payer: Self-pay

## 2023-01-18 MED ORDER — DILTIAZEM HCL ER 240 MG PO CP24
240.0000 mg | ORAL_CAPSULE | Freq: Every day | ORAL | 5 refills | Status: DC
Start: 2023-01-18 — End: 2023-07-10
  Filled 2023-01-18: qty 30, 30d supply, fill #0
  Filled 2023-02-15: qty 30, 30d supply, fill #1
  Filled 2023-03-15: qty 30, 30d supply, fill #2
  Filled 2023-04-16: qty 30, 30d supply, fill #3
  Filled 2023-05-14: qty 30, 30d supply, fill #4
  Filled 2023-06-11: qty 30, 30d supply, fill #5

## 2023-01-19 ENCOUNTER — Other Ambulatory Visit (HOSPITAL_COMMUNITY): Payer: Self-pay

## 2023-01-19 DIAGNOSIS — I129 Hypertensive chronic kidney disease with stage 1 through stage 4 chronic kidney disease, or unspecified chronic kidney disease: Secondary | ICD-10-CM | POA: Diagnosis not present

## 2023-01-19 DIAGNOSIS — E1122 Type 2 diabetes mellitus with diabetic chronic kidney disease: Secondary | ICD-10-CM | POA: Diagnosis not present

## 2023-01-19 DIAGNOSIS — J019 Acute sinusitis, unspecified: Secondary | ICD-10-CM | POA: Diagnosis not present

## 2023-01-19 DIAGNOSIS — E7800 Pure hypercholesterolemia, unspecified: Secondary | ICD-10-CM | POA: Diagnosis not present

## 2023-01-19 DIAGNOSIS — N183 Chronic kidney disease, stage 3 unspecified: Secondary | ICD-10-CM | POA: Diagnosis not present

## 2023-01-19 MED ORDER — AZITHROMYCIN 250 MG PO TABS
ORAL_TABLET | ORAL | 0 refills | Status: AC
  Filled 2023-01-19: qty 6, 5d supply, fill #0

## 2023-01-29 ENCOUNTER — Other Ambulatory Visit (HOSPITAL_COMMUNITY): Payer: Self-pay

## 2023-01-30 ENCOUNTER — Other Ambulatory Visit (HOSPITAL_COMMUNITY): Payer: Self-pay

## 2023-01-30 MED ORDER — PRAVASTATIN SODIUM 80 MG PO TABS
80.0000 mg | ORAL_TABLET | Freq: Every day | ORAL | 1 refills | Status: DC
  Filled 2023-01-30: qty 100, 100d supply, fill #0
  Filled 2023-05-14: qty 100, 100d supply, fill #1

## 2023-01-30 MED ORDER — CLOPIDOGREL BISULFATE 75 MG PO TABS
75.0000 mg | ORAL_TABLET | Freq: Every day | ORAL | 1 refills | Status: DC
  Filled 2023-01-30: qty 90, 90d supply, fill #0
  Filled 2023-04-26: qty 90, 90d supply, fill #1
  Filled 2023-07-23: qty 90, 90d supply, fill #2

## 2023-02-02 ENCOUNTER — Encounter: Payer: Self-pay | Admitting: Podiatry

## 2023-02-02 ENCOUNTER — Ambulatory Visit (INDEPENDENT_AMBULATORY_CARE_PROVIDER_SITE_OTHER): Payer: Medicare Other | Admitting: Podiatry

## 2023-02-02 VITALS — Ht 66.0 in | Wt 191.0 lb

## 2023-02-02 DIAGNOSIS — M79674 Pain in right toe(s): Secondary | ICD-10-CM

## 2023-02-02 DIAGNOSIS — Z794 Long term (current) use of insulin: Secondary | ICD-10-CM | POA: Diagnosis not present

## 2023-02-02 DIAGNOSIS — M79675 Pain in left toe(s): Secondary | ICD-10-CM

## 2023-02-02 DIAGNOSIS — B351 Tinea unguium: Secondary | ICD-10-CM

## 2023-02-02 DIAGNOSIS — E1142 Type 2 diabetes mellitus with diabetic polyneuropathy: Secondary | ICD-10-CM

## 2023-02-02 NOTE — Progress Notes (Signed)
This patient returns to my office for at risk foot care.  This patient requires this care by a professional since this patient will be at risk due to having diabetic neuropathy.  This patient is unable to cut nails herself since the patient cannot reach her nails.These nails are painful walking and wearing shoes.  This patient presents for at risk foot care today.  General Appearance  Alert, conversant and in no acute stress.  Vascular  Dorsalis pedis and posterior tibial  pulses are palpable  bilaterally.  Capillary return is within normal limits  bilaterally. Temperature is within normal limits  bilaterally.  Neurologic  Senn-Weinstein monofilament wire test diminished   bilaterally. Muscle power within normal limits bilaterally.  Nails Thick disfigured discolored nails with subungual debris  from hallux to fifth toes bilaterally. No evidence of bacterial infection or drainage bilaterally.  Orthopedic  No limitations of motion  feet .  No crepitus or effusions noted.  No bony pathology or digital deformities noted.  HAV  B/L.  Skin  normotropic skin with no porokeratosis noted bilaterally.  No signs of infections or ulcers noted.     Onychomycosis  Pain in right toes  Pain in left toes  Consent was obtained for treatment procedures.   Mechanical debridement of nails 1-5  bilaterally performed with a nail nipper.  Filed with dremel without incident.    Return office visit   3 months                   Told patient to return for periodic foot care and evaluation due to potential at risk complications.   Helane Gunther DPM

## 2023-02-15 ENCOUNTER — Other Ambulatory Visit (HOSPITAL_COMMUNITY): Payer: Self-pay

## 2023-02-20 ENCOUNTER — Other Ambulatory Visit (HOSPITAL_COMMUNITY): Payer: Self-pay

## 2023-02-20 MED ORDER — TRESIBA FLEXTOUCH 200 UNIT/ML ~~LOC~~ SOPN
60.0000 [IU] | PEN_INJECTOR | Freq: Two times a day (BID) | SUBCUTANEOUS | 0 refills | Status: AC
Start: 2023-02-20 — End: ?
  Filled 2023-02-20: qty 18, 30d supply, fill #0

## 2023-02-23 ENCOUNTER — Other Ambulatory Visit (HOSPITAL_COMMUNITY): Payer: Self-pay

## 2023-03-14 ENCOUNTER — Other Ambulatory Visit (HOSPITAL_COMMUNITY): Payer: Self-pay

## 2023-03-14 MED ORDER — LOSARTAN POTASSIUM 25 MG PO TABS
25.0000 mg | ORAL_TABLET | Freq: Every day | ORAL | 5 refills | Status: DC
  Filled 2023-03-14: qty 30, 30d supply, fill #0
  Filled 2023-04-16: qty 30, 30d supply, fill #1
  Filled 2023-05-14: qty 30, 30d supply, fill #2
  Filled 2023-06-11: qty 30, 30d supply, fill #3
  Filled 2023-07-16: qty 30, 30d supply, fill #4
  Filled 2023-08-13: qty 30, 30d supply, fill #5

## 2023-03-15 ENCOUNTER — Other Ambulatory Visit: Payer: Self-pay

## 2023-03-16 ENCOUNTER — Other Ambulatory Visit (HOSPITAL_COMMUNITY): Payer: Self-pay

## 2023-04-11 DIAGNOSIS — E1169 Type 2 diabetes mellitus with other specified complication: Secondary | ICD-10-CM | POA: Diagnosis not present

## 2023-05-04 ENCOUNTER — Encounter: Payer: Self-pay | Admitting: Podiatry

## 2023-05-04 ENCOUNTER — Ambulatory Visit (INDEPENDENT_AMBULATORY_CARE_PROVIDER_SITE_OTHER): Payer: Medicare Other | Admitting: Podiatry

## 2023-05-04 DIAGNOSIS — B351 Tinea unguium: Secondary | ICD-10-CM | POA: Diagnosis not present

## 2023-05-04 DIAGNOSIS — Z794 Long term (current) use of insulin: Secondary | ICD-10-CM

## 2023-05-04 DIAGNOSIS — M79675 Pain in left toe(s): Secondary | ICD-10-CM

## 2023-05-04 DIAGNOSIS — E1142 Type 2 diabetes mellitus with diabetic polyneuropathy: Secondary | ICD-10-CM | POA: Diagnosis not present

## 2023-05-04 DIAGNOSIS — M79674 Pain in right toe(s): Secondary | ICD-10-CM | POA: Diagnosis not present

## 2023-05-04 NOTE — Progress Notes (Signed)
This patient returns to my office for at risk foot care.  This patient requires this care by a professional since this patient will be at risk due to having diabetic neuropathy.  This patient is unable to cut nails herself since the patient cannot reach her nails.These nails are painful walking and wearing shoes.  This patient presents for at risk foot care today.  General Appearance  Alert, conversant and in no acute stress.  Vascular  Dorsalis pedis and posterior tibial  pulses are palpable  bilaterally.  Capillary return is within normal limits  bilaterally. Temperature is within normal limits  bilaterally.  Neurologic  Senn-Weinstein monofilament wire test diminished   bilaterally. Muscle power within normal limits bilaterally.  Nails Thick disfigured discolored nails with subungual debris  from hallux to fifth toes bilaterally. No evidence of bacterial infection or drainage bilaterally.  Orthopedic  No limitations of motion  feet .  No crepitus or effusions noted.  No bony pathology or digital deformities noted.  HAV  B/L.  Skin  normotropic skin with no porokeratosis noted bilaterally.  No signs of infections or ulcers noted.     Onychomycosis  Pain in right toes  Pain in left toes  Consent was obtained for treatment procedures.   Mechanical debridement of nails 1-5  bilaterally performed with a nail nipper.  Filed with dremel without incident.    Return office visit   3 months                   Told patient to return for periodic foot care and evaluation due to potential at risk complications.   Helane Gunther DPM

## 2023-05-14 ENCOUNTER — Other Ambulatory Visit (HOSPITAL_COMMUNITY): Payer: Self-pay

## 2023-05-25 DIAGNOSIS — E1140 Type 2 diabetes mellitus with diabetic neuropathy, unspecified: Secondary | ICD-10-CM | POA: Diagnosis not present

## 2023-05-25 DIAGNOSIS — N183 Chronic kidney disease, stage 3 unspecified: Secondary | ICD-10-CM | POA: Diagnosis not present

## 2023-05-25 DIAGNOSIS — Z794 Long term (current) use of insulin: Secondary | ICD-10-CM | POA: Diagnosis not present

## 2023-05-25 DIAGNOSIS — I4510 Unspecified right bundle-branch block: Secondary | ICD-10-CM | POA: Diagnosis not present

## 2023-05-25 DIAGNOSIS — Z0000 Encounter for general adult medical examination without abnormal findings: Secondary | ICD-10-CM | POA: Diagnosis not present

## 2023-05-25 DIAGNOSIS — E7800 Pure hypercholesterolemia, unspecified: Secondary | ICD-10-CM | POA: Diagnosis not present

## 2023-05-25 DIAGNOSIS — I519 Heart disease, unspecified: Secondary | ICD-10-CM | POA: Diagnosis not present

## 2023-05-25 DIAGNOSIS — I110 Hypertensive heart disease with heart failure: Secondary | ICD-10-CM | POA: Diagnosis not present

## 2023-05-25 DIAGNOSIS — E1122 Type 2 diabetes mellitus with diabetic chronic kidney disease: Secondary | ICD-10-CM | POA: Diagnosis not present

## 2023-05-25 DIAGNOSIS — I672 Cerebral atherosclerosis: Secondary | ICD-10-CM | POA: Diagnosis not present

## 2023-06-11 ENCOUNTER — Other Ambulatory Visit: Payer: Self-pay

## 2023-06-11 ENCOUNTER — Other Ambulatory Visit (HOSPITAL_COMMUNITY): Payer: Self-pay

## 2023-06-11 DIAGNOSIS — M170 Bilateral primary osteoarthritis of knee: Secondary | ICD-10-CM | POA: Diagnosis not present

## 2023-06-11 MED ORDER — INSULIN PEN NEEDLE 32G X 4 MM MISC
1.0000 | Freq: Every day | 3 refills | Status: AC
  Filled 2023-06-11: qty 100, 100d supply, fill #0

## 2023-06-11 MED ORDER — AMOXICILLIN 500 MG PO CAPS
500.0000 mg | ORAL_CAPSULE | Freq: Three times a day (TID) | ORAL | 0 refills | Status: AC
  Filled 2023-06-11: qty 21, 7d supply, fill #0

## 2023-06-11 MED ORDER — EZETIMIBE 10 MG PO TABS
10.0000 mg | ORAL_TABLET | Freq: Every day | ORAL | 6 refills | Status: DC
  Filled 2023-06-11: qty 30, 30d supply, fill #0
  Filled 2023-07-16: qty 30, 30d supply, fill #1
  Filled 2023-08-13: qty 30, 30d supply, fill #2
  Filled 2023-09-10: qty 30, 30d supply, fill #3
  Filled 2023-10-15: qty 30, 30d supply, fill #4
  Filled 2023-11-12: qty 30, 30d supply, fill #5
  Filled 2023-12-10: qty 30, 30d supply, fill #6

## 2023-06-18 ENCOUNTER — Other Ambulatory Visit (HOSPITAL_COMMUNITY): Payer: Self-pay

## 2023-07-10 ENCOUNTER — Other Ambulatory Visit (HOSPITAL_COMMUNITY): Payer: Self-pay

## 2023-07-10 MED ORDER — DAPAGLIFLOZIN PROPANEDIOL 10 MG PO TABS
10.0000 mg | ORAL_TABLET | Freq: Every day | ORAL | 1 refills | Status: AC
  Filled 2023-07-10: qty 30, 30d supply, fill #0
  Filled 2023-09-10: qty 30, 30d supply, fill #1
  Filled 2023-10-15: qty 30, 30d supply, fill #2
  Filled 2023-11-12: qty 30, 30d supply, fill #3
  Filled 2023-12-10: qty 30, 30d supply, fill #4

## 2023-07-10 MED ORDER — DILTIAZEM HCL ER 240 MG PO CP24
240.0000 mg | ORAL_CAPSULE | Freq: Every day | ORAL | 5 refills | Status: DC
  Filled 2023-07-10: qty 30, 30d supply, fill #0
  Filled 2023-08-13: qty 30, 30d supply, fill #1
  Filled 2023-09-10: qty 30, 30d supply, fill #2
  Filled 2023-10-15: qty 30, 30d supply, fill #3
  Filled 2023-11-12: qty 30, 30d supply, fill #4
  Filled 2023-12-12: qty 30, 30d supply, fill #5

## 2023-07-23 ENCOUNTER — Other Ambulatory Visit (HOSPITAL_COMMUNITY): Payer: Self-pay

## 2023-07-24 ENCOUNTER — Other Ambulatory Visit (HOSPITAL_COMMUNITY): Payer: Self-pay

## 2023-07-24 MED ORDER — CLOPIDOGREL BISULFATE 75 MG PO TABS
75.0000 mg | ORAL_TABLET | Freq: Every day | ORAL | 1 refills | Status: AC
  Filled 2023-07-24: qty 100, 100d supply, fill #0
  Filled 2023-11-05: qty 100, 100d supply, fill #1

## 2023-08-06 ENCOUNTER — Ambulatory Visit: Admitting: Podiatry

## 2023-08-06 ENCOUNTER — Encounter: Payer: Self-pay | Admitting: Podiatry

## 2023-08-06 DIAGNOSIS — B351 Tinea unguium: Secondary | ICD-10-CM | POA: Diagnosis not present

## 2023-08-06 DIAGNOSIS — M79674 Pain in right toe(s): Secondary | ICD-10-CM | POA: Diagnosis not present

## 2023-08-06 DIAGNOSIS — Z794 Long term (current) use of insulin: Secondary | ICD-10-CM

## 2023-08-06 DIAGNOSIS — M79675 Pain in left toe(s): Secondary | ICD-10-CM

## 2023-08-06 DIAGNOSIS — E1142 Type 2 diabetes mellitus with diabetic polyneuropathy: Secondary | ICD-10-CM

## 2023-08-06 NOTE — Progress Notes (Signed)
This patient returns to my office for at risk foot care.  This patient requires this care by a professional since this patient will be at risk due to having diabetic neuropathy.  This patient is unable to cut nails herself since the patient cannot reach her nails.These nails are painful walking and wearing shoes.  This patient presents for at risk foot care today.  General Appearance  Alert, conversant and in no acute stress.  Vascular  Dorsalis pedis and posterior tibial  pulses are palpable  bilaterally.  Capillary return is within normal limits  bilaterally. Temperature is within normal limits  bilaterally.  Neurologic  Senn-Weinstein monofilament wire test diminished   bilaterally. Muscle power within normal limits bilaterally.  Nails Thick disfigured discolored nails with subungual debris  from hallux to fifth toes bilaterally. No evidence of bacterial infection or drainage bilaterally.  Orthopedic  No limitations of motion  feet .  No crepitus or effusions noted.  No bony pathology or digital deformities noted.  HAV  B/L.  Skin  normotropic skin with no porokeratosis noted bilaterally.  No signs of infections or ulcers noted.     Onychomycosis  Pain in right toes  Pain in left toes  Consent was obtained for treatment procedures.   Mechanical debridement of nails 1-5  bilaterally performed with a nail nipper.  Filed with dremel without incident.    Return office visit   3 months                   Told patient to return for periodic foot care and evaluation due to potential at risk complications.   Helane Gunther DPM

## 2023-08-13 ENCOUNTER — Other Ambulatory Visit (HOSPITAL_COMMUNITY): Payer: Self-pay

## 2023-08-14 ENCOUNTER — Other Ambulatory Visit (HOSPITAL_COMMUNITY): Payer: Self-pay

## 2023-08-29 ENCOUNTER — Other Ambulatory Visit (HOSPITAL_COMMUNITY): Payer: Self-pay

## 2023-08-29 MED ORDER — PRAVASTATIN SODIUM 80 MG PO TABS
80.0000 mg | ORAL_TABLET | Freq: Every day | ORAL | 1 refills | Status: AC
  Filled 2023-08-29: qty 100, 100d supply, fill #0
  Filled 2023-11-26: qty 100, 100d supply, fill #1

## 2023-09-10 ENCOUNTER — Other Ambulatory Visit (HOSPITAL_COMMUNITY): Payer: Self-pay

## 2023-09-10 ENCOUNTER — Other Ambulatory Visit: Payer: Self-pay

## 2023-09-11 ENCOUNTER — Other Ambulatory Visit (HOSPITAL_COMMUNITY): Payer: Self-pay

## 2023-09-11 DIAGNOSIS — L821 Other seborrheic keratosis: Secondary | ICD-10-CM | POA: Diagnosis not present

## 2023-09-11 DIAGNOSIS — L814 Other melanin hyperpigmentation: Secondary | ICD-10-CM | POA: Diagnosis not present

## 2023-09-11 DIAGNOSIS — D225 Melanocytic nevi of trunk: Secondary | ICD-10-CM | POA: Diagnosis not present

## 2023-09-11 MED ORDER — LOSARTAN POTASSIUM 25 MG PO TABS
25.0000 mg | ORAL_TABLET | Freq: Every day | ORAL | 5 refills | Status: AC
  Filled 2023-09-11: qty 30, 30d supply, fill #0
  Filled 2023-10-15: qty 30, 30d supply, fill #1
  Filled 2023-11-12: qty 30, 30d supply, fill #2
  Filled 2023-12-10: qty 30, 30d supply, fill #3
  Filled 2024-01-14: qty 30, 30d supply, fill #4

## 2023-10-18 DIAGNOSIS — I129 Hypertensive chronic kidney disease with stage 1 through stage 4 chronic kidney disease, or unspecified chronic kidney disease: Secondary | ICD-10-CM | POA: Diagnosis not present

## 2023-10-18 DIAGNOSIS — E1122 Type 2 diabetes mellitus with diabetic chronic kidney disease: Secondary | ICD-10-CM | POA: Diagnosis not present

## 2023-10-18 DIAGNOSIS — I639 Cerebral infarction, unspecified: Secondary | ICD-10-CM | POA: Diagnosis not present

## 2023-10-18 DIAGNOSIS — N183 Chronic kidney disease, stage 3 unspecified: Secondary | ICD-10-CM | POA: Diagnosis not present

## 2023-10-24 ENCOUNTER — Other Ambulatory Visit (HOSPITAL_COMMUNITY): Payer: Self-pay

## 2023-10-24 MED ORDER — AMOXICILLIN 500 MG PO CAPS
ORAL_CAPSULE | ORAL | 0 refills | Status: AC
  Filled 2023-10-24: qty 22, 8d supply, fill #0

## 2023-10-24 MED ORDER — HYDROCODONE-ACETAMINOPHEN 5-325 MG PO TABS
1.0000 | ORAL_TABLET | ORAL | 0 refills | Status: AC | PRN
  Filled 2023-10-24: qty 8, 2d supply, fill #0

## 2023-10-29 DIAGNOSIS — Z1231 Encounter for screening mammogram for malignant neoplasm of breast: Secondary | ICD-10-CM | POA: Diagnosis not present

## 2023-11-06 ENCOUNTER — Encounter: Payer: Self-pay | Admitting: Podiatry

## 2023-11-06 ENCOUNTER — Ambulatory Visit: Admitting: Podiatry

## 2023-11-06 DIAGNOSIS — E1142 Type 2 diabetes mellitus with diabetic polyneuropathy: Secondary | ICD-10-CM | POA: Diagnosis not present

## 2023-11-06 DIAGNOSIS — B351 Tinea unguium: Secondary | ICD-10-CM

## 2023-11-06 DIAGNOSIS — Z794 Long term (current) use of insulin: Secondary | ICD-10-CM

## 2023-11-06 DIAGNOSIS — M79675 Pain in left toe(s): Secondary | ICD-10-CM

## 2023-11-06 DIAGNOSIS — M79674 Pain in right toe(s): Secondary | ICD-10-CM | POA: Diagnosis not present

## 2023-11-06 NOTE — Progress Notes (Signed)
This patient returns to my office for at risk foot care.  This patient requires this care by a professional since this patient will be at risk due to having diabetic neuropathy.  This patient is unable to cut nails herself since the patient cannot reach her nails.These nails are painful walking and wearing shoes.  This patient presents for at risk foot care today.  General Appearance  Alert, conversant and in no acute stress.  Vascular  Dorsalis pedis and posterior tibial  pulses are palpable  bilaterally.  Capillary return is within normal limits  bilaterally. Temperature is within normal limits  bilaterally.  Neurologic  Senn-Weinstein monofilament wire test diminished   bilaterally. Muscle power within normal limits bilaterally.  Nails Thick disfigured discolored nails with subungual debris  from hallux to fifth toes bilaterally. No evidence of bacterial infection or drainage bilaterally.  Orthopedic  No limitations of motion  feet .  No crepitus or effusions noted.  No bony pathology or digital deformities noted.  HAV  B/L.  Skin  normotropic skin with no porokeratosis noted bilaterally.  No signs of infections or ulcers noted.     Onychomycosis  Pain in right toes  Pain in left toes  Consent was obtained for treatment procedures.   Mechanical debridement of nails 1-5  bilaterally performed with a nail nipper.  Filed with dremel without incident.    Return office visit   3 months                   Told patient to return for periodic foot care and evaluation due to potential at risk complications.   Helane Gunther DPM

## 2024-01-14 ENCOUNTER — Other Ambulatory Visit: Payer: Self-pay

## 2024-01-14 ENCOUNTER — Other Ambulatory Visit (HOSPITAL_COMMUNITY): Payer: Self-pay

## 2024-01-14 MED ORDER — DILTIAZEM HCL ER 240 MG PO CP24
240.0000 mg | ORAL_CAPSULE | Freq: Every day | ORAL | 5 refills | Status: AC
  Filled 2024-01-14: qty 30, 30d supply, fill #0

## 2024-01-14 MED ORDER — EZETIMIBE 10 MG PO TABS
10.0000 mg | ORAL_TABLET | Freq: Every day | ORAL | 4 refills | Status: AC
  Filled 2024-01-14: qty 30, 30d supply, fill #0

## 2024-02-06 ENCOUNTER — Encounter: Payer: Self-pay | Admitting: Podiatry

## 2024-02-06 ENCOUNTER — Ambulatory Visit: Admitting: Podiatry

## 2024-02-06 DIAGNOSIS — E1142 Type 2 diabetes mellitus with diabetic polyneuropathy: Secondary | ICD-10-CM

## 2024-02-06 DIAGNOSIS — B351 Tinea unguium: Secondary | ICD-10-CM

## 2024-02-06 NOTE — Progress Notes (Signed)
This patient returns to my office for at risk foot care.  This patient requires this care by a professional since this patient will be at risk due to having diabetic neuropathy.  This patient is unable to cut nails herself since the patient cannot reach her nails.These nails are painful walking and wearing shoes.  This patient presents for at risk foot care today.  General Appearance  Alert, conversant and in no acute stress.  Vascular  Dorsalis pedis and posterior tibial  pulses are palpable  bilaterally.  Capillary return is within normal limits  bilaterally. Temperature is within normal limits  bilaterally.  Neurologic  Senn-Weinstein monofilament wire test diminished   bilaterally. Muscle power within normal limits bilaterally.  Nails Thick disfigured discolored nails with subungual debris  from hallux to fifth toes bilaterally. No evidence of bacterial infection or drainage bilaterally.  Orthopedic  No limitations of motion  feet .  No crepitus or effusions noted.  No bony pathology or digital deformities noted.  HAV  B/L.  Skin  normotropic skin with no porokeratosis noted bilaterally.  No signs of infections or ulcers noted.     Onychomycosis  Pain in right toes  Pain in left toes  Consent was obtained for treatment procedures.   Mechanical debridement of nails 1-5  bilaterally performed with a nail nipper.  Filed with dremel without incident.    Return office visit   3 months                   Told patient to return for periodic foot care and evaluation due to potential at risk complications.   Helane Gunther DPM

## 2024-05-05 ENCOUNTER — Ambulatory Visit: Admitting: Podiatry
